# Patient Record
Sex: Female | Born: 1975 | Race: Black or African American | Hispanic: No | Marital: Single | Smoking: Current every day smoker
Health system: Southern US, Community
[De-identification: ages and names within clinical notes are randomized; demographics above are authoritative.]

## PROBLEM LIST (undated history)

## (undated) DIAGNOSIS — M461 Sacroiliitis, not elsewhere classified: Secondary | ICD-10-CM

## (undated) DIAGNOSIS — E8881 Metabolic syndrome: Secondary | ICD-10-CM

## (undated) DIAGNOSIS — E119 Type 2 diabetes mellitus without complications: Secondary | ICD-10-CM

## (undated) DIAGNOSIS — Z8742 Personal history of other diseases of the female genital tract: Secondary | ICD-10-CM

## (undated) DIAGNOSIS — J45909 Unspecified asthma, uncomplicated: Secondary | ICD-10-CM

## (undated) DIAGNOSIS — I10 Essential (primary) hypertension: Secondary | ICD-10-CM

## (undated) DIAGNOSIS — T7840XA Allergy, unspecified, initial encounter: Secondary | ICD-10-CM

## (undated) DIAGNOSIS — J449 Chronic obstructive pulmonary disease, unspecified: Secondary | ICD-10-CM

## (undated) DIAGNOSIS — G8929 Other chronic pain: Secondary | ICD-10-CM

## (undated) DIAGNOSIS — M25562 Pain in left knee: Secondary | ICD-10-CM

## (undated) DIAGNOSIS — M7661 Achilles tendinitis, right leg: Secondary | ICD-10-CM

## (undated) DIAGNOSIS — R590 Localized enlarged lymph nodes: Secondary | ICD-10-CM

## (undated) DIAGNOSIS — M25561 Pain in right knee: Secondary | ICD-10-CM

## (undated) HISTORY — PX: LEEP: SHX91

## (undated) HISTORY — DX: Sacroiliitis, not elsewhere classified: M46.1

## (undated) HISTORY — DX: Metabolic syndrome: E88.810

## (undated) HISTORY — DX: Metabolic syndrome: E88.81

## (undated) HISTORY — PX: CHOLECYSTECTOMY: SHX55

## (undated) HISTORY — DX: Pain in right knee: M25.561

## (undated) HISTORY — DX: Allergy, unspecified, initial encounter: T78.40XA

## (undated) HISTORY — DX: Pain in left knee: M25.562

## (undated) HISTORY — DX: Other chronic pain: G89.29

## (undated) HISTORY — PX: DILATION AND CURETTAGE OF UTERUS: SHX78

## (undated) HISTORY — PX: TUBAL LIGATION: SHX77

## (undated) HISTORY — DX: Localized enlarged lymph nodes: R59.0

## (undated) HISTORY — DX: Personal history of other diseases of the female genital tract: Z87.42

## (undated) HISTORY — DX: Achilles tendinitis, right leg: M76.61

---

## 2004-06-12 ENCOUNTER — Emergency Department: Payer: Self-pay | Admitting: Internal Medicine

## 2004-06-14 ENCOUNTER — Emergency Department: Payer: Self-pay | Admitting: Emergency Medicine

## 2004-06-16 ENCOUNTER — Emergency Department: Payer: Self-pay | Admitting: Emergency Medicine

## 2004-08-25 ENCOUNTER — Emergency Department: Payer: Self-pay | Admitting: Unknown Physician Specialty

## 2005-06-05 ENCOUNTER — Emergency Department: Payer: Self-pay | Admitting: Emergency Medicine

## 2006-01-06 ENCOUNTER — Emergency Department: Payer: Self-pay | Admitting: General Practice

## 2006-04-19 ENCOUNTER — Emergency Department: Payer: Self-pay | Admitting: Emergency Medicine

## 2006-06-09 ENCOUNTER — Emergency Department: Payer: Self-pay | Admitting: Emergency Medicine

## 2006-07-15 ENCOUNTER — Emergency Department: Payer: Self-pay | Admitting: Emergency Medicine

## 2006-10-07 ENCOUNTER — Emergency Department: Payer: Self-pay | Admitting: Emergency Medicine

## 2006-11-17 ENCOUNTER — Emergency Department: Payer: Self-pay | Admitting: Emergency Medicine

## 2006-12-31 ENCOUNTER — Emergency Department: Payer: Self-pay

## 2007-03-21 ENCOUNTER — Emergency Department: Payer: Self-pay | Admitting: Emergency Medicine

## 2007-05-23 ENCOUNTER — Emergency Department: Payer: Self-pay | Admitting: Emergency Medicine

## 2007-09-30 DIAGNOSIS — K219 Gastro-esophageal reflux disease without esophagitis: Secondary | ICD-10-CM

## 2007-10-28 ENCOUNTER — Ambulatory Visit: Payer: Self-pay | Admitting: Family Medicine

## 2007-11-24 ENCOUNTER — Emergency Department: Payer: Self-pay | Admitting: Emergency Medicine

## 2008-02-17 ENCOUNTER — Emergency Department: Payer: Self-pay | Admitting: Emergency Medicine

## 2008-03-23 ENCOUNTER — Emergency Department: Payer: Self-pay | Admitting: Emergency Medicine

## 2008-03-23 ENCOUNTER — Other Ambulatory Visit: Payer: Self-pay

## 2008-06-04 ENCOUNTER — Emergency Department: Payer: Self-pay | Admitting: Emergency Medicine

## 2008-10-05 ENCOUNTER — Emergency Department: Payer: Self-pay | Admitting: Emergency Medicine

## 2008-12-13 ENCOUNTER — Emergency Department: Payer: Self-pay | Admitting: Emergency Medicine

## 2009-01-11 ENCOUNTER — Emergency Department: Payer: Self-pay | Admitting: Emergency Medicine

## 2009-04-11 ENCOUNTER — Emergency Department: Payer: Self-pay | Admitting: Emergency Medicine

## 2009-04-25 ENCOUNTER — Emergency Department: Payer: Self-pay | Admitting: Emergency Medicine

## 2009-05-16 ENCOUNTER — Emergency Department: Payer: Self-pay | Admitting: Emergency Medicine

## 2009-05-24 ENCOUNTER — Emergency Department: Payer: Self-pay | Admitting: Emergency Medicine

## 2009-08-07 ENCOUNTER — Emergency Department: Payer: Self-pay | Admitting: Emergency Medicine

## 2009-09-28 ENCOUNTER — Emergency Department: Payer: Self-pay | Admitting: Unknown Physician Specialty

## 2009-12-15 ENCOUNTER — Emergency Department: Payer: Self-pay | Admitting: Emergency Medicine

## 2010-01-19 ENCOUNTER — Emergency Department: Payer: Self-pay | Admitting: Emergency Medicine

## 2010-05-29 ENCOUNTER — Emergency Department: Payer: Self-pay | Admitting: Emergency Medicine

## 2010-07-20 ENCOUNTER — Ambulatory Visit: Payer: Self-pay | Admitting: Family Medicine

## 2010-08-20 ENCOUNTER — Emergency Department: Payer: Self-pay | Admitting: Emergency Medicine

## 2010-11-11 ENCOUNTER — Emergency Department: Payer: Self-pay | Admitting: Emergency Medicine

## 2011-02-21 ENCOUNTER — Emergency Department: Payer: Self-pay | Admitting: Emergency Medicine

## 2011-03-15 ENCOUNTER — Emergency Department: Payer: Self-pay | Admitting: Emergency Medicine

## 2011-03-29 ENCOUNTER — Emergency Department: Payer: Self-pay | Admitting: Emergency Medicine

## 2011-04-01 ENCOUNTER — Ambulatory Visit: Payer: Self-pay | Admitting: Gastroenterology

## 2011-04-02 ENCOUNTER — Ambulatory Visit: Payer: Self-pay | Admitting: Gastroenterology

## 2011-04-05 ENCOUNTER — Emergency Department: Payer: Self-pay | Admitting: Emergency Medicine

## 2011-05-02 IMAGING — US US EXTREM LOW VENOUS*R*
1 series · 17 of 24 positions shown · non-contrast
Comparison: none

REASON FOR EXAM: one month of calf pain
COMMENTS:   LMP: Two weeks ago

PROCEDURE:     US  - US DOPPLER LOW EXTR RIGHT  - August 20, 2010 [DATE]
RESULT:     Comparison: None

[Series 1: us extrem low venous*right* · 17 of 28 slices shown]
[im 1/28]
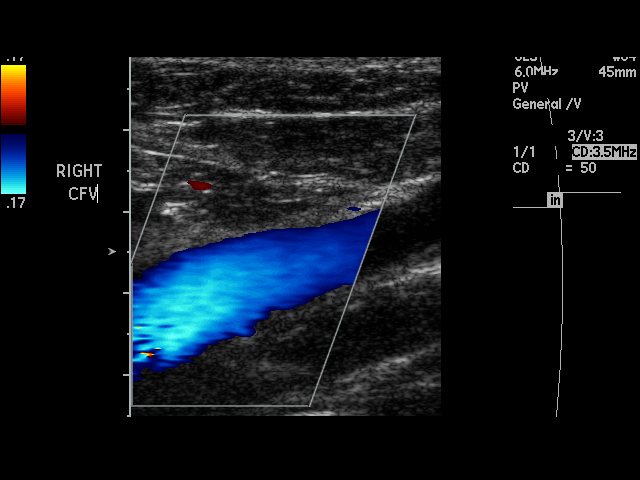
[im 3/28]
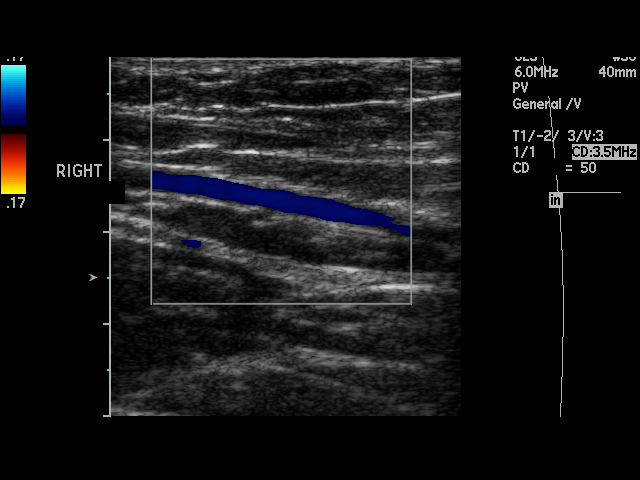
[im 4/28]
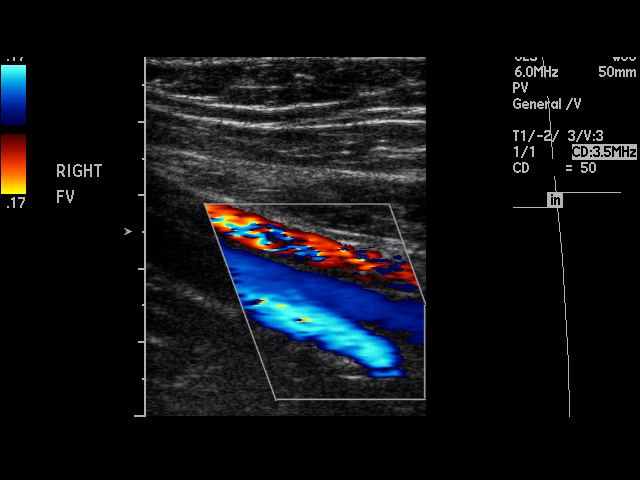
[im 5/28]
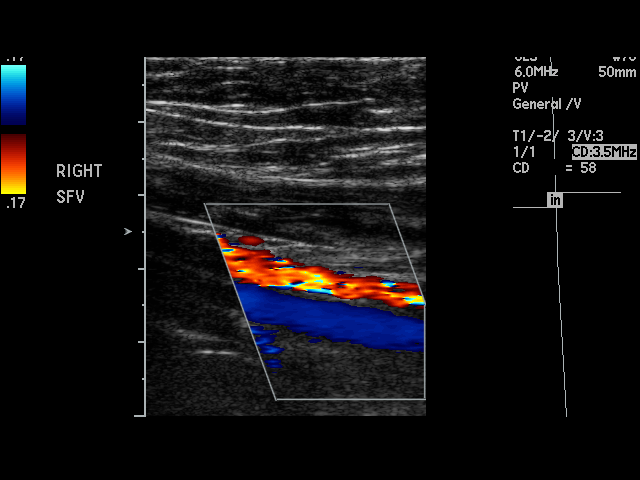
[im 8/28]
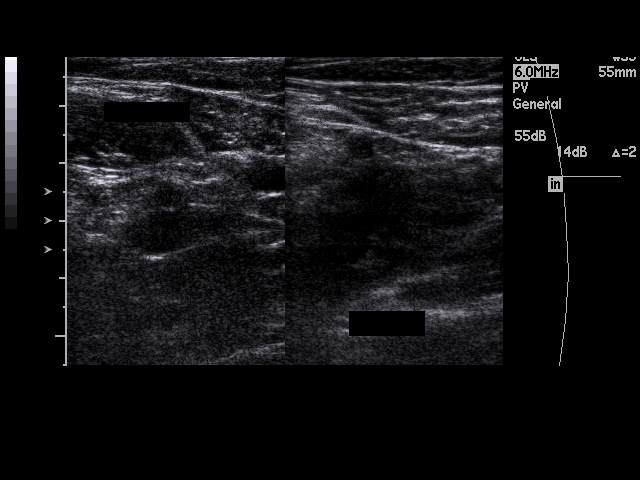
[im 9/28]
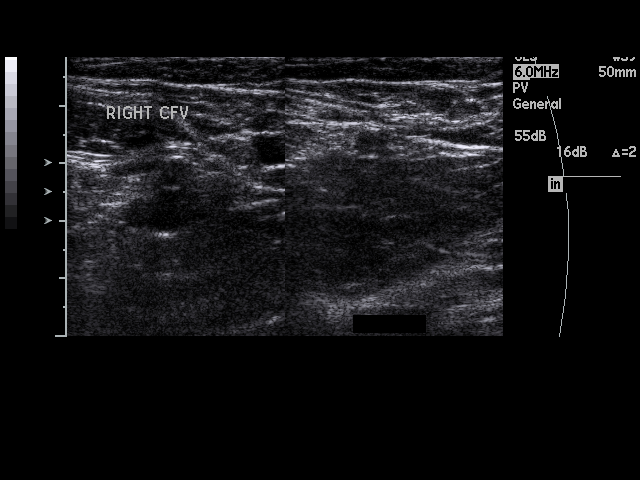
[im 11/28]
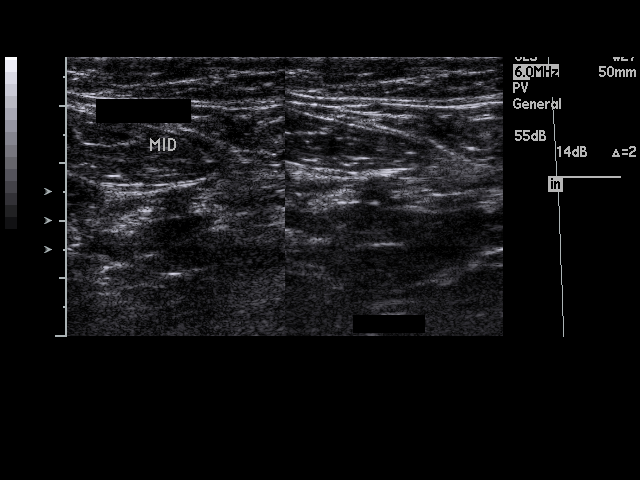
[im 12/28]
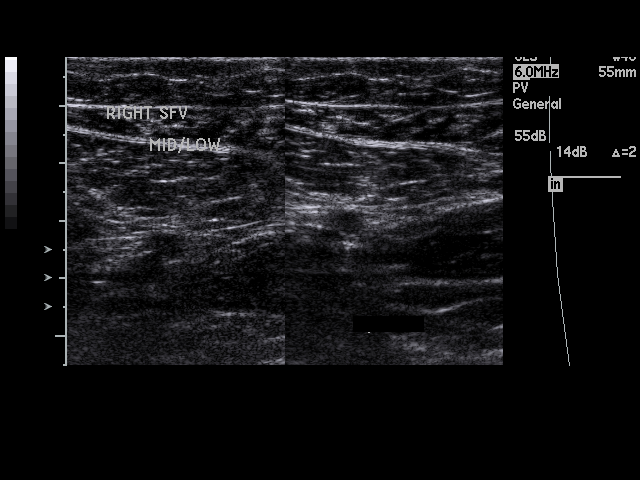
[im 15/28]
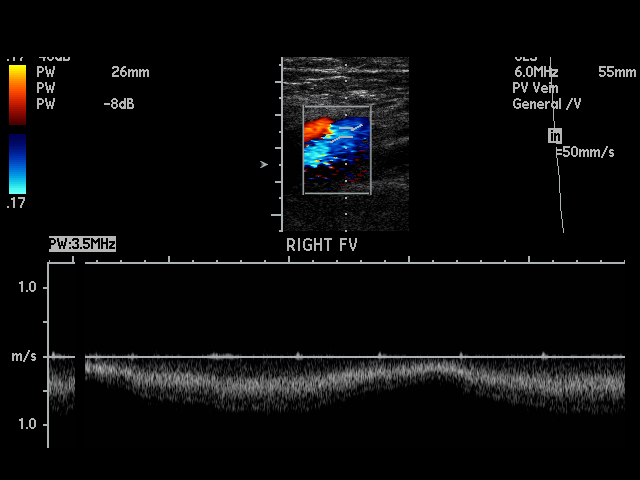
[im 16/28]
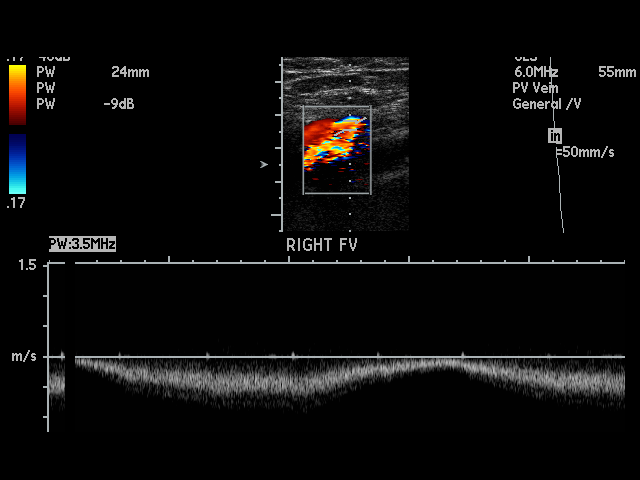
[im 17/28]
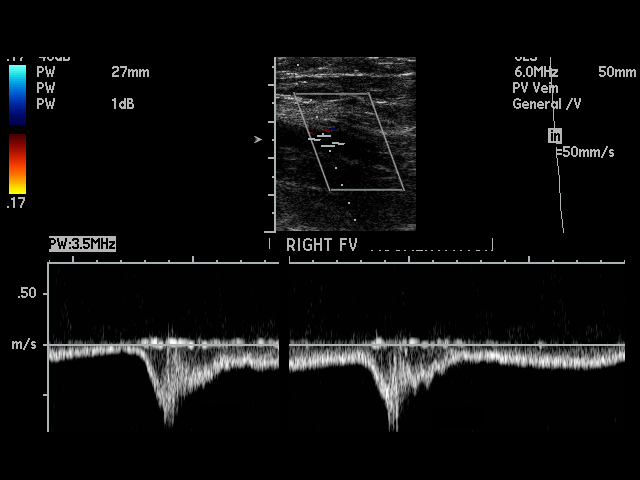
[im 19/28]
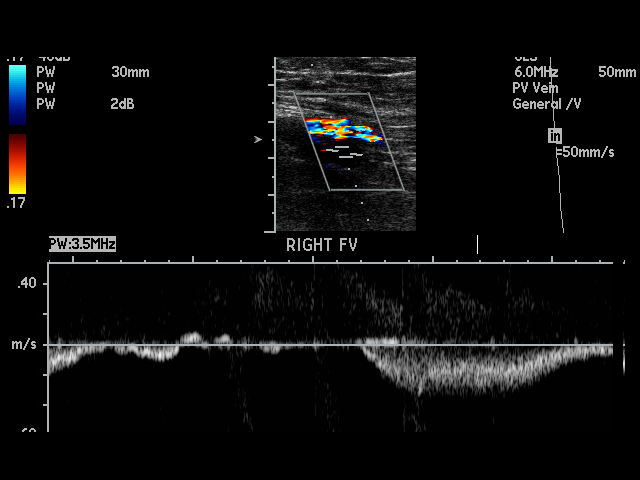
[im 20/28]
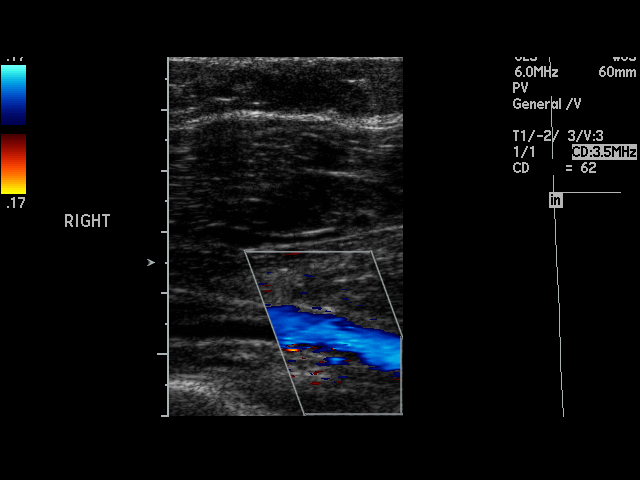
[im 23/28]
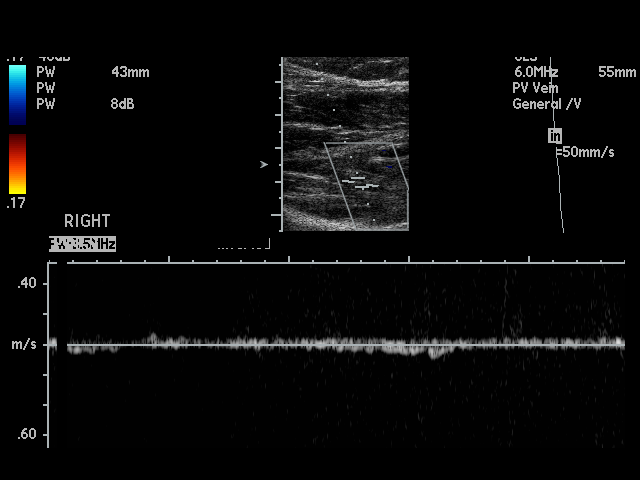
[im 24/28]
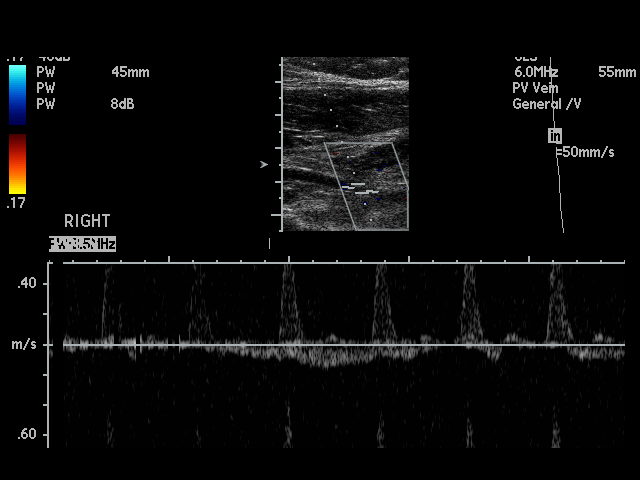
[im 25/28]
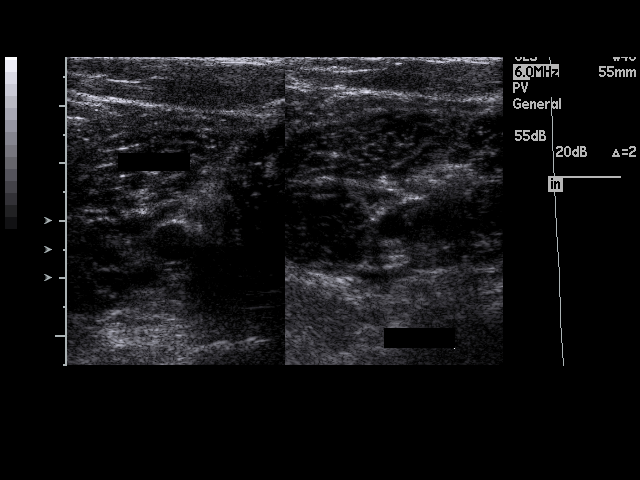
[im 28/28]
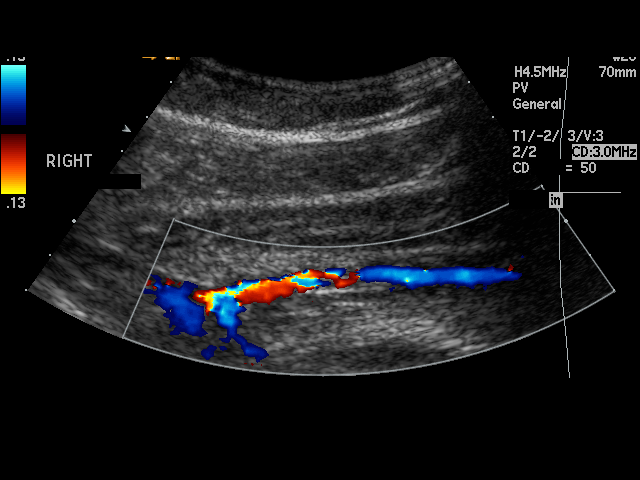

[17 of 24 positions shown; findings below may reference images not displayed]

FINDINGS: Multiple longitudinal and transverse gray-scale as well as color
and spectral Doppler images of the right lower extremity veins were obtained
from the common femoral veins through the popliteal veins.

The right common femoral, greater saphenous, femoral, popliteal veins, and
venous trifurcation are patent, demonstrating normal color-flow and
compressibility. No intraluminal thrombus is identified.There is normal
respiratory variation and augmentation demonstrated at all vein levels.
IMPRESSION: No evidence of DVT in the right lower extremity.

## 2011-11-19 ENCOUNTER — Emergency Department: Payer: Self-pay | Admitting: Emergency Medicine

## 2011-12-31 ENCOUNTER — Ambulatory Visit: Payer: Self-pay | Admitting: Family Medicine

## 2012-02-03 ENCOUNTER — Emergency Department: Payer: Self-pay | Admitting: Emergency Medicine

## 2014-01-08 ENCOUNTER — Emergency Department: Payer: Self-pay | Admitting: Emergency Medicine

## 2014-01-08 LAB — URINALYSIS, COMPLETE
Bacteria: NONE SEEN
Bilirubin,UR: NEGATIVE
GLUCOSE, UR: NEGATIVE mg/dL (ref 0–75)
Ketone: NEGATIVE
Nitrite: NEGATIVE
PH: 7 (ref 4.5–8.0)
Protein: NEGATIVE
RBC,UR: 7 /HPF (ref 0–5)
Specific Gravity: 1.009 (ref 1.003–1.030)
Squamous Epithelial: 9
WBC UR: 4 /HPF (ref 0–5)

## 2014-01-08 LAB — GC/CHLAMYDIA PROBE AMP

## 2014-01-08 LAB — WET PREP, GENITAL

## 2014-08-09 ENCOUNTER — Encounter: Payer: Self-pay | Admitting: Family Medicine

## 2014-08-19 ENCOUNTER — Encounter: Payer: Self-pay | Admitting: Family Medicine

## 2014-09-05 ENCOUNTER — Encounter: Payer: Self-pay | Admitting: Family Medicine

## 2014-09-19 ENCOUNTER — Encounter: Payer: Self-pay | Admitting: Family Medicine

## 2014-12-15 LAB — HM PAP SMEAR: HM Pap smear: NEGATIVE

## 2014-12-16 LAB — LIPID PANEL
CHOLESTEROL: 192 mg/dL (ref 0–200)
HDL: 63 mg/dL (ref 35–70)
LDL Cholesterol: 96 mg/dL
Triglycerides: 163 mg/dL — AB (ref 40–160)

## 2014-12-16 LAB — HEMOGLOBIN A1C: Hgb A1c MFr Bld: 6.6 % — AB (ref 4.0–6.0)

## 2015-01-19 ENCOUNTER — Other Ambulatory Visit: Payer: Self-pay | Admitting: Rheumatology

## 2015-01-19 DIAGNOSIS — M545 Low back pain: Principal | ICD-10-CM

## 2015-01-19 DIAGNOSIS — G8929 Other chronic pain: Secondary | ICD-10-CM

## 2015-01-26 ENCOUNTER — Ambulatory Visit
Admission: RE | Admit: 2015-01-26 | Discharge: 2015-01-26 | Disposition: A | Discharge status: Home / Self Care | Payer: Medicaid Other | Source: Ambulatory Visit | Attending: Rheumatology | Admitting: Rheumatology

## 2015-01-26 ENCOUNTER — Telehealth: Payer: Self-pay | Admitting: Family Medicine

## 2015-01-26 DIAGNOSIS — M545 Low back pain: Principal | ICD-10-CM

## 2015-01-26 DIAGNOSIS — G8929 Other chronic pain: Secondary | ICD-10-CM

## 2015-01-27 MED ORDER — DIAZEPAM 5 MG PO TABS: ORAL_TABLET | ORAL | Status: DC

## 2015-01-27 NOTE — Telephone Encounter (Signed)
Done, sent valium to her pharmacy

## 2015-01-27 NOTE — Telephone Encounter (Signed)
LFT PT MESSAGE

## 2015-02-01 ENCOUNTER — Ambulatory Visit
Admission: RE | Admit: 2015-02-01 | Discharge: 2015-02-01 | Disposition: A | Discharge status: Home / Self Care | Payer: Medicaid Other | Source: Ambulatory Visit | Attending: Rheumatology | Admitting: Rheumatology

## 2015-02-01 DIAGNOSIS — R200 Anesthesia of skin: Secondary | ICD-10-CM | POA: Insufficient documentation

## 2015-02-01 DIAGNOSIS — M545 Low back pain: Secondary | ICD-10-CM | POA: Insufficient documentation

## 2015-02-01 DIAGNOSIS — G8929 Other chronic pain: Secondary | ICD-10-CM | POA: Diagnosis not present

## 2015-02-02 ENCOUNTER — Telehealth: Payer: Self-pay | Admitting: Family Medicine

## 2015-02-02 NOTE — Telephone Encounter (Signed)
Pt had an MRI done with Dr. Gavin Potters and he stated that she has an abnormal disc and she now needs a referral to to Dr. Yves Dill.

## 2015-02-06 ENCOUNTER — Telehealth: Payer: Self-pay | Admitting: Family Medicine

## 2015-02-06 NOTE — Telephone Encounter (Signed)
[facility] called and stated that the pt needs to be referred to Dr. Yves Dill for abn discs and injections. Dr. Yves Dill' office needs a referral before scheduling an appt.

## 2015-02-06 NOTE — Telephone Encounter (Signed)
Called patient to ask why she was being referred to Dr. Yves Dill office, we have no notes or documentation for reasoning. Patient states Dr. Lavenia Atlas ordered a MRI of the lumbar spine and found DegerativeDisc and wants her to follow up with Dr. Yves Dill. I informed the patient we would have to have her come here and sign a medical release form for records before being able to do a referral.

## 2015-02-09 ENCOUNTER — Telehealth: Payer: Self-pay | Admitting: Family Medicine

## 2015-02-10 NOTE — Telephone Encounter (Signed)
errenous encounter °

## 2015-02-15 ENCOUNTER — Encounter: Payer: Self-pay | Admitting: Emergency Medicine

## 2015-02-15 ENCOUNTER — Emergency Department
Admission: EM | Admit: 2015-02-15 | Discharge: 2015-02-15 | Disposition: A | Discharge status: Home / Self Care | Payer: Medicaid Other | Attending: Emergency Medicine | Admitting: Emergency Medicine

## 2015-02-15 DIAGNOSIS — I10 Essential (primary) hypertension: Secondary | ICD-10-CM | POA: Diagnosis not present

## 2015-02-15 DIAGNOSIS — Z91040 Latex allergy status: Secondary | ICD-10-CM | POA: Diagnosis not present

## 2015-02-15 DIAGNOSIS — M5417 Radiculopathy, lumbosacral region: Secondary | ICD-10-CM | POA: Insufficient documentation

## 2015-02-15 DIAGNOSIS — G8929 Other chronic pain: Secondary | ICD-10-CM | POA: Diagnosis not present

## 2015-02-15 DIAGNOSIS — M545 Low back pain: Secondary | ICD-10-CM | POA: Diagnosis present

## 2015-02-15 DIAGNOSIS — M5416 Radiculopathy, lumbar region: Secondary | ICD-10-CM

## 2015-02-15 DIAGNOSIS — E119 Type 2 diabetes mellitus without complications: Secondary | ICD-10-CM | POA: Diagnosis not present

## 2015-02-15 DIAGNOSIS — Z720 Tobacco use: Secondary | ICD-10-CM | POA: Diagnosis not present

## 2015-02-15 HISTORY — DX: Type 2 diabetes mellitus without complications: E11.9

## 2015-02-15 HISTORY — DX: Chronic obstructive pulmonary disease, unspecified: J44.9

## 2015-02-15 HISTORY — DX: Unspecified asthma, uncomplicated: J45.909

## 2015-02-15 HISTORY — DX: Essential (primary) hypertension: I10

## 2015-02-15 MED ORDER — OXYCODONE-ACETAMINOPHEN 7.5-325 MG PO TABS: 1.0000 | ORAL_TABLET | Freq: Four times a day (QID) | ORAL | Status: DC | PRN

## 2015-02-15 MED ORDER — KETOROLAC TROMETHAMINE 10 MG PO TABS: 10.0000 mg | ORAL_TABLET | Freq: Four times a day (QID) | ORAL | Status: DC | PRN

## 2015-02-15 NOTE — ED Notes (Signed)
Patient arrives to ED pod d with c/o generalized pain due to a previously diagnosed slipped disk. Patient is ambulatory in Pod D. Patient states that she sees a pain clinic with her last visit on 02-04-15. Patient denies any other symptoms outside of uncontrolled pain.

## 2015-02-15 NOTE — ED Notes (Signed)
Patient stable at time of discharge. 

## 2015-02-15 NOTE — ED Provider Notes (Signed)
[facility] mergency Department Provider Note  ____________________________________________  Time seen: Approximately 3:10 PM  I have reviewed the triage vital signs and the nursing notes.   HISTORY  Chief Complaint Pain    HPI Kristie Santana is a 39 y.o. female today for chronic back pain. She states she had MRI 3 weeks ago show that she has an abnormal disc. Family doctor not prescribe any pain medicine since she's pending pain management she is not until 03/06/2015. She stated the pain down her left leg. Patient denies any bladder or bowel dysfunction. She is rating her pain as 8 over 10.  Past Medical History  Diagnosis Date  . Diabetes mellitus without complication   . Hypertension   . Asthma   . COPD (chronic obstructive pulmonary disease)     There are no active problems to display for this patient.   Past Surgical History  Procedure Laterality Date  . Leep    . Cholecystectomy      Current Outpatient Rx  Name  Route  Sig  Dispense  Refill  . diazepam (VALIUM) 5 MG tablet      30 minutes prior to procedure and repeat x1prn   2 tablet   0   . ketorolac (TORADOL) 10 MG tablet   Oral   Take 1 tablet (10 mg total) by mouth every 6 (six) hours as needed.   20 tablet   0   . oxyCODONE-acetaminophen (PERCOCET) 7.5-325 MG per tablet   Oral   Take 1 tablet by mouth every 6 (six) hours as needed for severe pain.   12 tablet   0     Allergies Fish allergy; Latex; and Sulfa antibiotics  No family history on file.  Social History History  Substance Use Topics  . Smoking status: Current Some Day Smoker  . Smokeless tobacco: Not on file  . Alcohol Use: Yes    Review of Systems Constitutional: No fever/chills Eyes: No visual changes. ENT: No sore throat. Cardiovascular: Denies chest pain. Respiratory: Denies shortness of breath. Gastrointestinal: No abdominal pain.  No nausea, no vomiting.  No diarrhea.  No  constipation. Genitourinary: Negative for dysuria. Musculoskeletal positive for back pain. Skin: Negative for rash. Neurological: Negative for headaches, focal weakness or numbness. Endocrine: Tension and diabetes. Hematological/Lymphatic: Allergic/Immunilogical: See medication list10-point ROS otherwise negative.  ____________________________________________   PHYSICAL EXAM:  VITAL SIGNS: ED Triage Vitals  Enc Vitals Group     BP 02/15/15 1350 122/70 mmHg     Pulse Rate 02/15/15 1350 89     Resp 02/15/15 1350 22     Temp 02/15/15 1350 98.6 F (37 C)     Temp Source 02/15/15 1350 Oral     SpO2 02/15/15 1350 96 %     Weight --      Height --      Head Cir --      Peak Flow --      Pain Score 02/15/15 1350 8     Pain Loc --      Pain Edu? --      Excl. in GC? --     Constitutional: Alert and oriented. Well appearing and in no acute distress. Eyes: Conjunctivae are normal. PERRL. EOMI. Head: Atraumatic. Nose: No congestion/rhinnorhea. Mouth/Throat: Mucous membranes are moist.  Oropharynx non-erythematous. Neck: No stridor. No cervical spine tenderness to palpation. Hematological/Lymphatic/Immunilogical: No cervical lymphadenopathy. Cardiovascular: Normal rate, regular rhythm. Grossly normal heart sounds.  Good peripheral circulation. Respiratory: Normal respiratory effort.  No retractions. Lungs CTAB. Gastrointestinal: Soft and nontender. No distention. No abdominal bruits. No CVA tenderness. Musculoskeletal:No spinal deformity. TTP L3-S1.  Decreased ROM all fields. Negative straight leg test. Neurologic:  Normal speech and language. No gross focal neurologic deficits are appreciated. Speech is normal. No gait instability. Skin:  Skin is warm, dry and intact. No rash noted. Psychiatric: Mood and affect are normal. Speech and behavior are normal.  ____________________________________________   LABS (all labs ordered are listed, but only abnormal results are  displayed)  Labs Reviewed - No data to display ____________________________________________  EKG   ____________________________________________  RADIOLOGY   ____________________________________________   PROCEDURES  Procedure(s) performed: None  Critical Care performed: No  ____________________________________________   INITIAL IMPRESSION / ASSESSMENT AND PLAN / ED COURSE  Pertinent labs & imaging results that were available during my care of the patient were reviewed by me and considered in my medical decision making (see chart for details).  Chronic back pain.Discuss ER policy on chronic pain management.  Advised follow up with Family Doctor pending Pain clinic appointment. Discharge with Percocet and Toradol. ____________________________________________   FINAL CLINICAL IMPRESSION(S) / ED DIAGNOSES  Final diagnoses:  Chronic radicular pain of lower back      Joni Reining, PA-C 02/15/15 1532  Emily Filbert, MD 02/15/15 [phone]

## 2015-02-15 NOTE — ED Notes (Signed)
Pt reports that she has pain all over, it has been going on for a year. KC referred her to the pain clinic and she has an appt with them on July 18th. Pt reports that she cant take the pain and needs something now. She states that she had an MRI three weeks ago and they told her that she had an abnormal disc.

## 2015-02-22 ENCOUNTER — Encounter: Payer: Self-pay | Admitting: Family Medicine

## 2015-02-22 DIAGNOSIS — Z8742 Personal history of other diseases of the female genital tract: Secondary | ICD-10-CM | POA: Insufficient documentation

## 2015-02-22 DIAGNOSIS — R739 Hyperglycemia, unspecified: Secondary | ICD-10-CM | POA: Insufficient documentation

## 2015-02-22 DIAGNOSIS — J449 Chronic obstructive pulmonary disease, unspecified: Secondary | ICD-10-CM | POA: Insufficient documentation

## 2015-02-22 DIAGNOSIS — E8881 Metabolic syndrome: Secondary | ICD-10-CM | POA: Insufficient documentation

## 2015-02-22 DIAGNOSIS — E1142 Type 2 diabetes mellitus with diabetic polyneuropathy: Secondary | ICD-10-CM | POA: Insufficient documentation

## 2015-02-22 DIAGNOSIS — Z87442 Personal history of urinary calculi: Secondary | ICD-10-CM | POA: Insufficient documentation

## 2015-02-22 DIAGNOSIS — M461 Sacroiliitis, not elsewhere classified: Secondary | ICD-10-CM | POA: Insufficient documentation

## 2015-02-22 DIAGNOSIS — Z8614 Personal history of Methicillin resistant Staphylococcus aureus infection: Secondary | ICD-10-CM | POA: Insufficient documentation

## 2015-02-22 DIAGNOSIS — E785 Hyperlipidemia, unspecified: Secondary | ICD-10-CM | POA: Insufficient documentation

## 2015-02-22 DIAGNOSIS — F17200 Nicotine dependence, unspecified, uncomplicated: Secondary | ICD-10-CM | POA: Insufficient documentation

## 2015-02-22 DIAGNOSIS — M47819 Spondylosis without myelopathy or radiculopathy, site unspecified: Secondary | ICD-10-CM | POA: Insufficient documentation

## 2015-02-22 DIAGNOSIS — E669 Obesity, unspecified: Secondary | ICD-10-CM | POA: Insufficient documentation

## 2015-02-22 DIAGNOSIS — G8929 Other chronic pain: Secondary | ICD-10-CM | POA: Insufficient documentation

## 2015-02-22 DIAGNOSIS — J309 Allergic rhinitis, unspecified: Secondary | ICD-10-CM | POA: Insufficient documentation

## 2015-02-22 DIAGNOSIS — M7660 Achilles tendinitis, unspecified leg: Secondary | ICD-10-CM | POA: Insufficient documentation

## 2015-02-22 DIAGNOSIS — I10 Essential (primary) hypertension: Secondary | ICD-10-CM | POA: Insufficient documentation

## 2015-02-23 ENCOUNTER — Ambulatory Visit: Payer: Self-pay | Admitting: Family Medicine

## 2015-02-23 ENCOUNTER — Ambulatory Visit (INDEPENDENT_AMBULATORY_CARE_PROVIDER_SITE_OTHER): Payer: Medicaid Other | Admitting: Family Medicine

## 2015-02-23 ENCOUNTER — Encounter: Payer: Self-pay | Admitting: Family Medicine

## 2015-02-23 VITALS — BP 124/76 | HR 82 | Temp 97.7°F | Resp 18 | Ht 66.0 in | Wt 210.5 lb

## 2015-02-23 DIAGNOSIS — J411 Mucopurulent chronic bronchitis: Secondary | ICD-10-CM | POA: Diagnosis not present

## 2015-02-23 DIAGNOSIS — G8929 Other chronic pain: Secondary | ICD-10-CM

## 2015-02-23 DIAGNOSIS — J309 Allergic rhinitis, unspecified: Secondary | ICD-10-CM

## 2015-02-23 DIAGNOSIS — M47819 Spondylosis without myelopathy or radiculopathy, site unspecified: Secondary | ICD-10-CM

## 2015-02-23 DIAGNOSIS — E119 Type 2 diabetes mellitus without complications: Secondary | ICD-10-CM | POA: Diagnosis not present

## 2015-02-23 DIAGNOSIS — E785 Hyperlipidemia, unspecified: Secondary | ICD-10-CM | POA: Diagnosis not present

## 2015-02-23 DIAGNOSIS — I10 Essential (primary) hypertension: Secondary | ICD-10-CM | POA: Diagnosis not present

## 2015-02-23 DIAGNOSIS — K219 Gastro-esophageal reflux disease without esophagitis: Secondary | ICD-10-CM

## 2015-02-23 DIAGNOSIS — J3089 Other allergic rhinitis: Secondary | ICD-10-CM

## 2015-02-23 MED ORDER — OXYCODONE-ACETAMINOPHEN 7.5-325 MG PO TABS: 1.0000 | ORAL_TABLET | Freq: Two times a day (BID) | ORAL | Status: DC | PRN

## 2015-02-23 MED ORDER — CHLORTHALIDONE 25 MG PO TABS: 25.0000 mg | ORAL_TABLET | Freq: Every day | ORAL | Status: DC

## 2015-02-23 MED ORDER — OMEPRAZOLE 20 MG PO CPDR: 20.0000 mg | DELAYED_RELEASE_CAPSULE | Freq: Two times a day (BID) | ORAL | Status: DC

## 2015-02-23 MED ORDER — DILTIAZEM HCL ER COATED BEADS 180 MG PO CP24: 180.0000 mg | ORAL_CAPSULE | Freq: Every day | ORAL | Status: DC

## 2015-02-23 MED ORDER — LORATADINE 10 MG PO TABS: 10.0000 mg | ORAL_TABLET | Freq: Every day | ORAL | Status: DC

## 2015-02-23 MED ORDER — METFORMIN HCL 850 MG PO TABS: 850.0000 mg | ORAL_TABLET | Freq: Every day | ORAL | Status: DC

## 2015-02-23 MED ORDER — FLUTICASONE FUROATE-VILANTEROL 100-25 MCG/INH IN AEPB: 1.0000 | INHALATION_SPRAY | Freq: Every day | RESPIRATORY_TRACT | Status: DC

## 2015-02-23 NOTE — Progress Notes (Signed)
Name: Kristie Santana   MRN: [id number]    DOB: 08-19-1976   Date:02/23/2015       Progress Note  Subjective  Chief Complaint  Chief Complaint  Patient presents with  . Medication Refill    3 month F/U  . Diabetes    Does not have a meter to check her sugar-lost 6 pounds since last visit trying to exercise but due to bulging disc unable to do certain stuff  . Hypertension    Improving  . COPD    with Asthma-SOB when smoking too many cigarettes or alot of movement.  . Allergic Rhinitis     Improving  . Back Pain    worsen did a MRI at [facility] found a bulging disc and is being referred to pain clinic on 03/06/2015     HPI  DMII: she was diagnosed in April 2016, hgbA1C was 6.6. She has changed her diet and has lost 6 lbs since last visit. She denies polyphagia, polydipsia or polyuria. She has been taking Metformin as prescribed, and has diarrhea with medication but tolerable.   HTN: bp is at goal, denies side effects of medication . No chest pain or palpitation  COPD/Chronic Bronchitis: using Qvar, she still smokes daily, she has a smokers cough, and has a productive cough at least three to four times weekly. Denies wheezing or SOB.   AR: taking medication and still has post-nasal drainage  Chronic back pain: seen by Dr. Gavin Potters, and did not respond to Humira, waiting to see Dr. Council Mechanic, she states she had a MRI that showed herniated disc disease. She was seen at [facility] and was given percocet and she would like a refill. She is also complaining of bilateral wrist and hand pain.   Patient Active Problem List   Diagnosis Date Noted  . Achilles tendinitis 02/22/2015  . CAFL (chronic airflow limitation) 02/22/2015  . Benign hypertension 02/22/2015  . Chronic pain 02/22/2015  . Inflammation of sacroiliac joint 02/22/2015  . Well controlled diabetes mellitus 02/22/2015  . Dyslipidemia 02/22/2015  . Dysmetabolic syndrome 02/22/2015  . H/O abnormal cervical Papanicolaou smear 02/22/2015   . H/O renal calculi 02/22/2015  . History of methicillin resistant Staphylococcus aureus infection 02/22/2015  . Allergic rhinitis 02/22/2015  . Compulsive tobacco user syndrome 02/22/2015  . 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 Obesity (BMI 30-39.9) 02/22/2015  . Gastro-esophageal reflux disease without esophagitis 09/30/2007    Past Surgical History  Procedure Laterality Date  . Leep    . Cholecystectomy    . Dilation and curettage of uterus    . Tubal ligation      Family History  Problem Relation Age of Onset  . Hypertension Mother   . Hypertension Sister   . Diabetes Sister     History   Social History  . Marital Status: Single    Spouse Name: N/A  . Number of Children: N/A  . Years of Education: N/A   Occupational History  . Not on file.   Social History Main Topics  . Smoking status: Current Some Day Smoker -- 1.00 packs/day for 24 years    Types: Cigarettes    Start date: 02/23/1991  . Smokeless tobacco: Never Used  . Alcohol Use: 0.0 oz/week    0 Standard drinks or equivalent per week     Comment: occasionally  . Drug Use: No  . Sexual Activity:    Partners: Male    Birth Control/ Protection: Other-see comments     Comment: Tubal Ligation  Other Topics Concern  . Not on file   Social History Narrative     Current outpatient prescriptions:  .  albuterol (PROAIR HFA) 108 (90 BASE) MCG/ACT inhaler, Inhale into the lungs., Disp: , Rfl:  .  chlorthalidone (HYGROTON) 25 MG tablet, Take 1 tablet (25 mg total) by mouth daily., Disp: 30 tablet, Rfl: 5 .  diltiazem (CARDIZEM CD) 180 MG 24 hr capsule, Take 1 capsule (180 mg total) by mouth at bedtime., Disp: 30 capsule, Rfl: 2 .  loratadine (CLARITIN) 10 MG tablet, Take 1 tablet (10 mg total) by mouth daily., Disp: 30 tablet, Rfl: 5 .  metFORMIN (GLUCOPHAGE) 850 MG tablet, Take 1 tablet (850 mg total) by mouth daily., Disp: 30 tablet, Rfl: 2 .  omeprazole (PRILOSEC) 20 MG capsule, Take 1 capsule (20 mg  total) by mouth 2 (two) times daily., Disp: 60 capsule, Rfl: 2 .  Fluticasone Furoate-Vilanterol (BREO ELLIPTA) 100-25 MCG/INH AEPB, Inhale 1 puff into the lungs daily., Disp: 1 each, Rfl: 2 .  oxyCODONE-acetaminophen (PERCOCET) 7.5-325 MG per tablet, Take 1 tablet by mouth 2 (two) times daily as needed for severe pain., Disp: 24 tablet, Rfl: 0  Allergies  Allergen Reactions  . Ace Inhibitors Cough  . Fish Allergy Itching and Swelling  . Latex   . Penicillins   . Sulfa Antibiotics Hives     ROS  Constitutional: Negative for fever but has  weight change, lost 6 lbs.  Respiratory: positive for cough negative for  shortness of breath.   Cardiovascular: Negative for chest pain or palpitations.  Gastrointestinal: Negative for abdominal pain, positive for  bowel changes - diarrhea  Musculoskeletal: Negative for gait problem positive for  joint pain and back pain  Skin: Negative for rash.  Neurological: Negative for dizziness or headache.  No other specific complaints in a complete review of systems (except as listed in HPI above).  Objective  Filed Vitals:   02/23/15 1035  BP: 124/76  Pulse: 82  Temp: 97.7 F (36.5 C)  TempSrc: Oral  Resp: 18  Height:  (1.676 m)  Weight: 210 lb 8 oz (95.482 kg)  SpO2: 97%    Body mass index is 33.99 kg/(m^2).  Physical Exam Constitutional: Patient appears well-developed and well-nourished. No distress. Obese Eyes:  No scleral icterus. PERL Neck: Normal range of motion. Neck supple. Cardiovascular: Normal rate, regular rhythm and normal heart sounds.  No murmur heard. No BLE edema. Pulmonary/Chest: Effort normal and breath sounds normal. No respiratory distress. Abdominal: Soft.  There is no tenderness. Psychiatric: Patient has a normal mood and affect. behavior is normal. Judgment and thought content normal. Muscular Skeletal: pain during palpation of lumbar spine, decrease in ROM, she also has bilateral knee pain with extension,  wrist pain but no synovitis  Recent Results (from the past 2160 hour(s))  HM PAP SMEAR     Status: None   Collection Time: 12/15/14 12:00 AM  Result Value Ref Range   HM Pap smear ASCUS but neg HPV   Lipid panel     Status: Abnormal   Collection Time: 12/16/14 12:00 AM  Result Value Ref Range   Triglycerides 163 (A) 40 - 160 mg/dL   Cholesterol 161 0 - 096 mg/dL   HDL 63 35 - 70 mg/dL   LDL Cholesterol 96 mg/dL  Hemoglobin E4V     Status: Abnormal   Collection Time: 12/16/14 12:00 AM  Result Value Ref Range   Hgb A1c MFr Bld 6.6 (A) 4.0 - 6.0 %  Diabetic Foot Exam - Simple   Simple Foot Form  Visual Inspection  No deformities, no ulcerations, no other skin breakdown bilaterally:  Yes  Sensation Testing  Intact to touch and monofilament testing bilaterally:  Yes  Pulse Check  Posterior Tibialis and Dorsalis pulse intact bilaterally:  Yes  Comments      PHQ2/9: Depression screen PHQ 2/9 02/23/2015  Decreased Interest 3  Down, Depressed, Hopeless 1  PHQ - 2 Score 4  Altered sleeping 3  Tired, decreased energy 1  Change in appetite 0  Feeling bad or failure about yourself  1  Trouble concentrating 0  Moving slowly or fidgety/restless 0  Suicidal thoughts 0  PHQ-9 Score 9  Difficult doing work/chores Very difficult   She states she is depressed because of her pain, she refuses starting on medication or therapy   Fall Risk: Fall Risk  02/23/2015  Falls in the past year? Yes  Number falls in past yr: 2 or more  Injury with Fall? Yes    Assessment & Plan  1. Dyslipidemia ASCVD 4%, explained that because of DM, she should start statin, but she wants to hold off, also discussed aspirin 81mg  daily and she will think about it  2. Benign hypertension At goal - diltiazem (CARDIZEM CD) 180 MG 24 hr capsule; Take 1 capsule (180 mg total) by mouth at bedtime.  Dispense: 30 capsule; Refill: 2 - chlorthalidone (HYGROTON) 25 MG tablet; Take 1 tablet (25 mg total) by mouth  daily.  Dispense: 30 tablet; Refill: 5  3. Well controlled diabetes mellitus Continue medication  - metFORMIN (GLUCOPHAGE) 850 MG tablet; Take 1 tablet (850 mg total) by mouth daily.  Dispense: 30 tablet; Refill: 2  4. Spondyloarthropathy Refill of medication for Dr. Council Mechanichasniss - oxyCODONE-acetaminophen (PERCOCET) 7.5-325 MG per tablet; Take 1 tablet by mouth 2 (two) times daily as needed for severe pain.  Dispense: 24 tablet; Refill: 0  5. Chronic pain  - oxyCODONE-acetaminophen (PERCOCET) 7.5-325 MG per tablet; Take 1 tablet by mouth 2 (two) times daily as needed for severe pain.  Dispense: 24 tablet; Refill: 0  6. Mucopurulent chronic bronchitis  - Fluticasone Furoate-Vilanterol (BREO ELLIPTA) 100-25 MCG/INH AEPB; Inhale 1 puff into the lungs daily.  Dispense: 1 each; Refill: 2  7. Gastro-esophageal reflux disease without esophagitis  - omeprazole (PRILOSEC) 20 MG capsule; Take 1 capsule (20 mg total) by mouth 2 (two) times daily.  Dispense: 60 capsule; Refill: 2  8. Perennial allergic rhinitis  - loratadine (CLARITIN) 10 MG tablet; Take 1 tablet (10 mg total) by mouth daily.  Dispense: 30 tablet; Refill: 5

## 2015-02-23 NOTE — Patient Instructions (Signed)

## 2015-02-24 ENCOUNTER — Other Ambulatory Visit: Payer: Self-pay

## 2015-03-03 ENCOUNTER — Other Ambulatory Visit: Payer: Self-pay | Admitting: Family Medicine

## 2015-03-03 NOTE — Telephone Encounter (Signed)
Patient requesting refill. 

## 2015-03-06 ENCOUNTER — Other Ambulatory Visit: Payer: Self-pay

## 2015-03-06 ENCOUNTER — Other Ambulatory Visit: Payer: Self-pay | Admitting: Family Medicine

## 2015-03-06 DIAGNOSIS — J42 Unspecified chronic bronchitis: Secondary | ICD-10-CM

## 2015-03-06 MED ORDER — FLUTICASONE-SALMETEROL 100-50 MCG/DOSE IN AEPB: 1.0000 | INHALATION_SPRAY | Freq: Two times a day (BID) | RESPIRATORY_TRACT | Status: DC

## 2015-03-20 ENCOUNTER — Ambulatory Visit (INDEPENDENT_AMBULATORY_CARE_PROVIDER_SITE_OTHER): Payer: Medicaid Other | Admitting: Family Medicine

## 2015-03-20 ENCOUNTER — Encounter: Payer: Self-pay | Admitting: Family Medicine

## 2015-03-20 VITALS — BP 124/73 | HR 97 | Temp 98.0°F | Resp 17 | Ht 66.0 in | Wt 201.0 lb

## 2015-03-20 DIAGNOSIS — J020 Streptococcal pharyngitis: Secondary | ICD-10-CM | POA: Diagnosis not present

## 2015-03-20 DIAGNOSIS — T3695XA Adverse effect of unspecified systemic antibiotic, initial encounter: Secondary | ICD-10-CM

## 2015-03-20 DIAGNOSIS — B379 Candidiasis, unspecified: Secondary | ICD-10-CM | POA: Diagnosis not present

## 2015-03-20 MED ORDER — FLUCONAZOLE 150 MG PO TABS: 150.0000 mg | ORAL_TABLET | Freq: Once | ORAL | Status: DC

## 2015-03-20 MED ORDER — AZITHROMYCIN 250 MG PO TABS: 250.0000 mg | ORAL_TABLET | Freq: Every day | ORAL | Status: DC

## 2015-03-20 NOTE — Progress Notes (Signed)
Name: Kristie Santana   MRN: [id number]    DOB: 06-Jul-1976   Date:03/20/2015       Progress Note  Subjective  Chief Complaint  Chief Complaint  Patient presents with  . Acute Visit    Sore throat/ ear ache x2 days  . Diabetes  . Hyperlipidemia    Sore Throat  This is a new problem. The current episode started in the past 7 days. There has been no fever. The pain is at a severity of 8/10. The pain is moderate. Associated symptoms include coughing, ear pain and trouble swallowing. Pertinent negatives include no ear discharge or headaches. She has had no exposure to strep or mono. She has tried nothing for the symptoms.      Past Medical History  Diagnosis Date  . Diabetes mellitus without complication   . Hypertension   . Asthma   . COPD (chronic obstructive pulmonary disease)   . Left cervical lymphadenopathy   . Dysmetabolic syndrome   . Bilateral sacroiliitis   . Bilateral chronic knee pain   . Achilles tendinitis of right lower extremity   . History of abnormal cervical Pap smear   . Allergy     Past Surgical History  Procedure Laterality Date  . Leep    . Cholecystectomy    . Dilation and curettage of uterus    . Tubal ligation      Family History  Problem Relation Age of Onset  . Hypertension Mother   . Hypertension Sister   . Diabetes Sister     History   Social History  . Marital Status: Single    Spouse Name: N/A  . Number of Children: N/A  . Years of Education: N/A   Occupational History  . Not on file.   Social History Main Topics  . Smoking status: Current Some Day Smoker -- 1.00 packs/day for 24 years    Types: Cigarettes    Start date: 02/23/1991  . Smokeless tobacco: Never Used  . Alcohol Use: 0.0 oz/week    0 Standard drinks or equivalent per week     Comment: occasionally  . Drug Use: No  . Sexual Activity:    Partners: Male    Birth Control/ Protection: Other-see comments     Comment: Tubal Ligation   Other Topics Concern  . Not  on file   Social History Narrative     Current outpatient prescriptions:  .  chlorthalidone (HYGROTON) 25 MG tablet, Take 1 tablet (25 mg total) by mouth daily., Disp: 30 tablet, Rfl: 5 .  diazepam (VALIUM) 5 MG tablet, take 1 tablet by mouth 30 MINUTES PRIOR TO PROCEDURE. AND 1 tablet if needed, Disp: , Rfl: 0 .  diltiazem (CARDIZEM CD) 180 MG 24 hr capsule, Take 1 capsule (180 mg total) by mouth at bedtime., Disp: 30 capsule, Rfl: 2 .  Fluticasone Furoate-Vilanterol (BREO ELLIPTA) 100-25 MCG/INH AEPB, Inhale 1 puff into the lungs daily., Disp: 1 each, Rfl: 2 .  Fluticasone-Salmeterol (ADVAIR) 100-50 MCG/DOSE AEPB, Inhale 1 puff into the lungs 2 (two) times daily., Disp: 1 each, Rfl: 3 .  ketorolac (TORADOL) 10 MG tablet, TAKE 1 TABLET BY MOUTH EVERY 6 HOURS AS NEEDED. (MAX 5 DAYS), Disp: , Rfl: 0 .  loratadine (CLARITIN) 10 MG tablet, Take 1 tablet (10 mg total) by mouth daily., Disp: 30 tablet, Rfl: 5 .  metFORMIN (GLUCOPHAGE) 850 MG tablet, Take 1 tablet (850 mg total) by mouth daily., Disp: 30 tablet, Rfl: 2 .  omeprazole (PRILOSEC) 20 MG capsule, Take 1 capsule (20 mg total) by mouth 2 (two) times daily., Disp: 60 capsule, Rfl: 2 .  omeprazole (PRILOSEC) 20 MG capsule, Take by mouth., Disp: , Rfl:  .  PROAIR HFA 108 (90 BASE) MCG/ACT inhaler, inhale 2 puffs by mouth every 4 hours if needed FOR SHORTNESS OF BREATH/WHEEZING, Disp: 8.5 Inhaler, Rfl: 1 .  tiZANidine (ZANAFLEX) 4 MG tablet, take 1 tablet by mouth twice a day if needed, Disp: , Rfl: 0 .  tiZANidine (ZANAFLEX) 4 MG tablet, 1 po bid prn, Disp: , Rfl:   Allergies  Allergen Reactions  . Ace Inhibitors Cough  . Fish Allergy Itching and Swelling  . Latex   . Penicillins   . Sulfa Antibiotics Hives     Review of Systems  Constitutional: Positive for chills. Negative for fever.  HENT: Positive for ear pain, sore throat and trouble swallowing. Negative for ear discharge.   Respiratory: Positive for cough.   Neurological:  Negative for headaches.      Objective  Filed Vitals:   03/20/15 0912  BP: 124/73  Pulse: 97  Temp: 98 F (36.7 C)  TempSrc: Oral  Resp: 17  Height: 5\' 6"  (1.676 m)  Weight: 201 lb (91.173 kg)  SpO2: 98%    Physical Exam  Constitutional: She is well-developed, well-nourished, and in no distress.  HENT:  Right Ear: Hearing and tympanic membrane normal.  Left Ear: Hearing and tympanic membrane normal.  Mouth/Throat: Oropharyngeal exudate and posterior oropharyngeal erythema present.  Cardiovascular: Normal rate and regular rhythm.   Pulmonary/Chest: Effort normal and breath sounds normal.  Lymphadenopathy:       Right cervical: No superficial cervical and no deep cervical adenopathy present.      Left cervical: No superficial cervical and no deep cervical adenopathy present.  Nursing note and vitals reviewed.     Assessment & Plan  1. Streptococcal pharyngitis Strep pharyngitis confirmed with testing. Patient will be started on azithromycin and advised conservative therapy including salt water gargles. RTC if no clinical improvement within 48 hours. - azithromycin (ZITHROMAX Z-PAK) 250 MG tablet; Take 1 tablet (250 mg total) by mouth daily. 2 tabs po x day 1, then 1 tab po q day x 4 days.  Dispense: 6 each; Refill: 0 - POCT rapid strep A  2. Antibiotic-induced yeast infection Diflucan 150 mg by mouth 1 time dose in case of antibiotic-induced yeast infection. - fluconazole (DIFLUCAN) 150 MG tablet; Take 1 tablet (150 mg total) by mouth once.  Dispense: 1 tablet; Refill: 0   Scarlet Abad Asad [facility] 03/20/2015 9:46 AM

## 2015-05-19 ENCOUNTER — Emergency Department
Admission: EM | Admit: 2015-05-19 | Discharge: 2015-05-19 | Disposition: A | Discharge status: Home / Self Care | Payer: Medicaid Other | Attending: Emergency Medicine | Admitting: Emergency Medicine

## 2015-05-19 DIAGNOSIS — L03811 Cellulitis of head [any part, except face]: Secondary | ICD-10-CM | POA: Insufficient documentation

## 2015-05-19 DIAGNOSIS — L0291 Cutaneous abscess, unspecified: Secondary | ICD-10-CM

## 2015-05-19 DIAGNOSIS — B373 Candidiasis of vulva and vagina: Secondary | ICD-10-CM | POA: Diagnosis not present

## 2015-05-19 DIAGNOSIS — Z79899 Other long term (current) drug therapy: Secondary | ICD-10-CM | POA: Insufficient documentation

## 2015-05-19 DIAGNOSIS — B3731 Acute candidiasis of vulva and vagina: Secondary | ICD-10-CM

## 2015-05-19 DIAGNOSIS — L0231 Cutaneous abscess of buttock: Secondary | ICD-10-CM | POA: Diagnosis present

## 2015-05-19 DIAGNOSIS — E119 Type 2 diabetes mellitus without complications: Secondary | ICD-10-CM | POA: Diagnosis not present

## 2015-05-19 DIAGNOSIS — Z720 Tobacco use: Secondary | ICD-10-CM | POA: Insufficient documentation

## 2015-05-19 DIAGNOSIS — Z7951 Long term (current) use of inhaled steroids: Secondary | ICD-10-CM | POA: Diagnosis not present

## 2015-05-19 DIAGNOSIS — L0390 Cellulitis, unspecified: Secondary | ICD-10-CM

## 2015-05-19 DIAGNOSIS — I10 Essential (primary) hypertension: Secondary | ICD-10-CM | POA: Insufficient documentation

## 2015-05-19 DIAGNOSIS — Z880 Allergy status to penicillin: Secondary | ICD-10-CM | POA: Insufficient documentation

## 2015-05-19 MED ORDER — FLUCONAZOLE 150 MG PO TABS: 150.0000 mg | ORAL_TABLET | ORAL | Status: DC

## 2015-05-19 MED ORDER — CLINDAMYCIN HCL 150 MG PO CAPS: 150.0000 mg | ORAL_CAPSULE | Freq: Four times a day (QID) | ORAL | Status: AC

## 2015-05-19 NOTE — ED Notes (Signed)
Pt has abscess on both buttocks. One is open on the right side, other is not opened.  Patient c/o yeast like infection.  C/o pain 8/10

## 2015-05-19 NOTE — ED Notes (Signed)
Pt reports she has an abscess on her bottom. Pt reports also has vaginal discharge that is white in color. Pt also reports has an area on her left head. Thinks it may be an ingrown hair.

## 2015-05-19 NOTE — ED Provider Notes (Signed)
[facility] Emergency Department Provider Note  ____________________________________________  Time seen: Approximately 11:20 AM  I have reviewed the triage vital signs and the nursing notes.   HISTORY  Chief Complaint Abscess    HPI Kristie Santana is a 39 y.o. female presents to the emergency department complaining of an abscess on both buttocks. She also states that she has recently taken some antibiotics for cellulitis on her scalp due to an ingrown hair and now has a "yeast infection." She states that the abscess on her right buttocks began 10-14 days ago and ruptured by itself and has been draining over the last 3-4 days. She states that she now has an area on her left buttocks that is feeling" similar to the first." She denies that area having open or draining. She denies fever/chills, nausea or vomiting, area or constipation.  The patient reports having a current East infection from her previous antibiotics. He states that she called her primary care but they have not sent and a prescription for the same. She has had multiple East infections in the past and reports that the symptoms are the same with itching and white discharge.   Past Medical History  Diagnosis Date  . Diabetes mellitus without complication   . Hypertension   . Asthma   . COPD (chronic obstructive pulmonary disease)   . Left cervical lymphadenopathy   . Dysmetabolic syndrome   . Bilateral sacroiliitis   . Bilateral chronic knee pain   . Achilles tendinitis of right lower extremity   . History of abnormal cervical Pap smear   . Allergy     Patient Active Problem List   Diagnosis Date Noted  . Streptococcal pharyngitis 03/20/2015  . Achilles tendinitis 02/22/2015  . CAFL (chronic airflow limitation) 02/22/2015  . Benign hypertension 02/22/2015  . Chronic pain 02/22/2015  . Inflammation of sacroiliac joint 02/22/2015  . Well controlled diabetes mellitus 02/22/2015  .  Dyslipidemia 02/22/2015  . Dysmetabolic syndrome 02/22/2015  . H/O abnormal cervical Papanicolaou smear 02/22/2015  . H/O renal calculi 02/22/2015  . History of methicillin resistant Staphylococcus aureus infection 02/22/2015  . Allergic rhinitis 02/22/2015  . Compulsive tobacco user syndrome 02/22/2015  . 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 Obesity (BMI 30-39.9) 02/22/2015  . Gastro-esophageal reflux disease without esophagitis 09/30/2007    Past Surgical History  Procedure Laterality Date  . Leep    . Cholecystectomy    . Dilation and curettage of uterus    . Tubal ligation      Current Outpatient Rx  Name  Route  Sig  Dispense  Refill  . azithromycin (ZITHROMAX Z-PAK) 250 MG tablet   Oral   Take 1 tablet (250 mg total) by mouth daily. 2 tabs po x day 1, then 1 tab po q day x 4 days.   6 each   0   . chlorthalidone (HYGROTON) 25 MG tablet   Oral   Take 1 tablet (25 mg total) by mouth daily.   30 tablet   5   . diazepam (VALIUM) 5 MG tablet      take 1 tablet by mouth 30 MINUTES PRIOR TO PROCEDURE. AND 1 tablet if needed      0   . diltiazem (CARDIZEM CD) 180 MG 24 hr capsule   Oral   Take 1 capsule (180 mg total) by mouth at bedtime.   30 capsule   2   . fluconazole (DIFLUCAN) 150 MG tablet   Oral   Take 1  tablet (150 mg total) by mouth once.   1 tablet   0   . Fluticasone Furoate-Vilanterol (BREO ELLIPTA) 100-25 MCG/INH AEPB   Inhalation   Inhale 1 puff into the lungs daily.   1 each   2   . Fluticasone-Salmeterol (ADVAIR) 100-50 MCG/DOSE AEPB   Inhalation   Inhale 1 puff into the lungs 2 (two) times daily.   1 each   3   . ketorolac (TORADOL) 10 MG tablet      TAKE 1 TABLET BY MOUTH EVERY 6 HOURS AS NEEDED. (MAX 5 DAYS)      0   . loratadine (CLARITIN) 10 MG tablet   Oral   Take 1 tablet (10 mg total) by mouth daily.   30 tablet   5   . metFORMIN (GLUCOPHAGE) 850 MG tablet   Oral   Take 1 tablet (850 mg total) by mouth daily.   30  tablet   2   . omeprazole (PRILOSEC) 20 MG capsule   Oral   Take 1 capsule (20 mg total) by mouth 2 (two) times daily.   60 capsule   2   . omeprazole (PRILOSEC) 20 MG capsule   Oral   Take by mouth.         Marland Kitchen PROAIR HFA 108 (90 BASE) MCG/ACT inhaler      inhale 2 puffs by mouth every 4 hours if needed FOR SHORTNESS OF BREATH/WHEEZING   8.5 Inhaler   1   . tiZANidine (ZANAFLEX) 4 MG tablet      take 1 tablet by mouth twice a day if needed      0   . tiZANidine (ZANAFLEX) 4 MG tablet      1 po bid prn           Allergies Ace inhibitors; Fish allergy; Latex; Penicillins; and Sulfa antibiotics  Family History  Problem Relation Age of Onset  . Hypertension Mother   . Hypertension Sister   . Diabetes Sister     Social History Social History  Substance Use Topics  . Smoking status: Current Some Day Smoker -- 1.00 packs/day for 24 years    Types: Cigarettes    Start date: 02/23/1991  . Smokeless tobacco: Never Used  . Alcohol Use: 0.0 oz/week    0 Standard drinks or equivalent per week     Comment: occasionally    Review of Systems Constitutional: No fever/chills Eyes: No visual changes. ENT: No sore throat. Cardiovascular: Denies chest pain. Respiratory: Denies shortness of breath. Gastrointestinal: No abdominal pain.  No nausea, no vomiting.  No diarrhea.  No constipation. Genitourinary: Negative for dysuria. Musculoskeletal: Negative for back pain. Skin: Negative for rash. Positive for abscess on right and left buttocks. Neurological: Negative for headaches, focal weakness or numbness.  10-point ROS otherwise negative.  ____________________________________________   PHYSICAL EXAM:  VITAL SIGNS: ED Triage Vitals  Enc Vitals Group     BP 05/19/15 1028 113/73 mmHg     Pulse Rate 05/19/15 1028 88     Resp 05/19/15 1028 16     Temp 05/19/15 1028 98.5 F (36.9 C)     Temp Source 05/19/15 1028 Oral     SpO2 05/19/15 1028 98 %     Weight  05/19/15 1030 196 lb 11.2 oz (89.223 kg)     Height 05/19/15 1028  (1.651 m)     Head Cir --      Peak Flow --      Pain  Score 05/19/15 1035 8     Pain Loc --      Pain Edu? --      Excl. in GC? --     Constitutional: Alert and oriented. Well appearing and in no acute distress. Eyes: Conjunctivae are normal. PERRL. EOMI. Head: Atraumatic. Nose: No congestion/rhinnorhea. Mouth/Throat: Mucous membranes are moist.  Oropharynx non-erythematous. Neck: No stridor.   Hematological/Lymphatic/Immunilogical: No cervical lymphadenopathy. Cardiovascular: Normal rate, regular rhythm. Grossly normal heart sounds.  Good peripheral circulation. Respiratory: Normal respiratory effort.  No retractions. Lungs CTAB. Gastrointestinal: Soft and nontender. No distention. No abdominal bruits. No CVA tenderness. Genitourinary: Patient declined pelvic exam. Musculoskeletal: No lower extremity tenderness nor edema.  No joint effusions. Neurologic:  Normal speech and language. No gross focal neurologic deficits are appreciated. No gait instability. Skin:  Skin is warm, dry and intact. No rash noted. Lesion noted to right buttocks in the 11:00 position. Area is erythematous and mildly edematous. Central erosion with purulent charge. No fluctuance noted. Firmness palpated around central erosion. Total area is 2 x 2 centimeters. Localized erythema and edema noted to left buttocks in the central region. No visible drainage. Area is approximately 0.5 x 0.5 cm Psychiatric: Mood and affect are normal. Speech and behavior are normal.  ____________________________________________   LABS (all labs ordered are listed, but only abnormal results are displayed)  Labs Reviewed - No data to display ____________________________________________  EKG   ____________________________________________  RADIOLOGY   ____________________________________________   PROCEDURES  Procedure(s) performed: None  Critical  Care performed: No  ____________________________________________   INITIAL IMPRESSION / ASSESSMENT AND PLAN / ED COURSE  Pertinent labs & imaging results that were available during my care of the patient were reviewed by me and considered in my medical decision making (see chart for details).  The patient's history, symptoms, and exam is most consistent with abscess to the right buttocks and cellulitis to the left buttocks. The area is firm to palpation on both. No signs of fluctuance. Abscess on right is draining purulent material. No open wound that requires packing. There is no need to incise and drain this area at this time. Discussed these findings and diagnosis with patient. She verbalized understanding. Discussed with options with patient and we'll place patient on antibiotic coverage. I am prescribing clindamycin due to patient's recent antibiotic use or other skin infection and for coverage of MRSA. Advised patient of risk for C. difficile infection on his medication. Gave strict precautions for follow-up should symptoms of C. difficile present. Advised patient to be taking a probiotic throughout the entire course of her antibiotic use.  Patient is also complaining of a yeast infection. She was supposed to receive prescriptions from her primary care but did not receive any for same. This ascending infection is from a previous use of antibiotics for cellulitis from an ingrown hair on her scalp. Patient declined a pelvic exam for further evaluation. We will provide a prescription for Diflucan for coverage. Advised patient to take prescription after finishing up this course of antibiotics. If symptoms persist advised patient to follow-up with her OB/GYN.  The patient was given strict ED precautions should area worsen on either buttocks. The patient instructions to take a probiotic as well as her antibiotics. ____________________________________________   FINAL CLINICAL IMPRESSION(S) / ED  DIAGNOSES  Final diagnoses:  Abscess and cellulitis  Vaginal candida      Racheal Patches, PA-C 05/19/15 1140  Delorise Royals Sherman, PA-C 05/19/15 1145  Arnaldo Natal, MD 05/19/15 [id number]

## 2015-05-19 NOTE — Discharge Instructions (Signed)
Abscess An abscess (boil or furuncle) is an infected area on or under the skin. This area is filled with yellowish-white fluid (pus) and other material (debris). HOME CARE   Only take medicines as told by your doctor.  If you were given antibiotic medicine, take it as directed. Finish the medicine even if you start to feel better.  If gauze is used, follow your doctor's directions for changing the gauze.  To avoid spreading the infection:  Keep your abscess covered with a bandage.  Wash your hands well.  Do not share personal care items, towels, or whirlpools with others.  Avoid skin contact with others.  Keep your skin and clothes clean around the abscess.  Keep all doctor visits as told. GET HELP RIGHT AWAY IF:   You have more pain, puffiness (swelling), or redness in the wound site.  You have more fluid or blood coming from the wound site.  You have muscle aches, chills, or you feel sick.  You have a fever. MAKE SURE YOU:   Understand these instructions.  Will watch your condition.  Will get help right away if you are not doing well or get worse. Document Released: 01/22/2008 Document Revised: 02/04/2012 Document Reviewed: 10/18/2011 [facility] Patient Information 2015 [place], [place]. This information is not intended to replace advice given to you by your health care provider. Make sure you discuss any questions you have with your health care provider.  Candidal Vulvovaginitis Candidal vulvovaginitis is an infection of the vagina and vulva. The vulva is the skin around the opening of the vagina. This may cause itching and discomfort in and around the vagina.  HOME CARE  Only take medicine as told by your doctor.  Do not have sex (intercourse) until the infection is healed or as told by your doctor.  Practice safe sex.  Tell your sex partner about your infection.  Do not douche or use tampons.  Wear cotton underwear. Do not wear tight pants or panty  hose.  Eat yogurt. This may help treat and prevent yeast infections. GET HELP RIGHT AWAY IF:   You have a fever.  Your problems get worse during treatment or do not get better in 3 days.  You have discomfort, irritation, or itching in your vagina or vulva area.  You have pain after sex.  You start to get belly (abdominal) pain. MAKE SURE YOU:  Understand these instructions.  Will watch your condition.  Will get help right away if you are not doing well or get worse. Document Released: 11/01/2008 Document Revised: 08/10/2013 Document Reviewed: 11/01/2008 [facility] Patient Information 2015 [place], [place]. This information is not intended to replace advice given to you by your health care provider. Make sure you discuss any questions you have with your health care provider.

## 2015-05-19 NOTE — ED Notes (Signed)
In room with PA to observe abscesses on buttocks. No interventions done at this time.

## 2015-05-19 NOTE — ED Notes (Signed)
E signature pad not working, patient signed print out signature.

## 2015-05-23 ENCOUNTER — Telehealth: Payer: Self-pay | Admitting: Family Medicine

## 2015-05-23 NOTE — Telephone Encounter (Signed)
Patient was seeing Dr Yves Dill [facility]) for cortizone shot and he just told her that it was nothing else he chould do for her due to she having so much pain. He told her to call her primary doctor to get a referral to the pain clinc.

## 2015-05-24 NOTE — Telephone Encounter (Signed)
She will need to see me again before I can make the referral

## 2015-05-24 NOTE — Telephone Encounter (Signed)
Pt has an appt scheduled on Monday.

## 2015-05-24 NOTE — Telephone Encounter (Signed)
Can you see if patient could come in today?

## 2015-05-29 ENCOUNTER — Ambulatory Visit (INDEPENDENT_AMBULATORY_CARE_PROVIDER_SITE_OTHER): Payer: Medicaid Other | Admitting: Family Medicine

## 2015-05-29 ENCOUNTER — Encounter: Payer: Self-pay | Admitting: Family Medicine

## 2015-05-29 VITALS — BP 130/86 | HR 88 | Temp 97.7°F | Resp 18 | Ht 65.0 in | Wt 199.5 lb

## 2015-05-29 DIAGNOSIS — I10 Essential (primary) hypertension: Secondary | ICD-10-CM

## 2015-05-29 DIAGNOSIS — J42 Unspecified chronic bronchitis: Secondary | ICD-10-CM | POA: Diagnosis not present

## 2015-05-29 DIAGNOSIS — M47819 Spondylosis without myelopathy or radiculopathy, site unspecified: Secondary | ICD-10-CM

## 2015-05-29 DIAGNOSIS — E119 Type 2 diabetes mellitus without complications: Secondary | ICD-10-CM

## 2015-05-29 DIAGNOSIS — G8929 Other chronic pain: Secondary | ICD-10-CM

## 2015-05-29 DIAGNOSIS — K219 Gastro-esophageal reflux disease without esophagitis: Secondary | ICD-10-CM | POA: Diagnosis not present

## 2015-05-29 DIAGNOSIS — M797 Fibromyalgia: Secondary | ICD-10-CM | POA: Diagnosis not present

## 2015-05-29 LAB — POCT GLYCOSYLATED HEMOGLOBIN (HGB A1C): HEMOGLOBIN A1C: 6.3

## 2015-05-29 MED ORDER — METFORMIN HCL 850 MG PO TABS: 850.0000 mg | ORAL_TABLET | Freq: Every day | ORAL | Status: DC

## 2015-05-29 MED ORDER — DILTIAZEM HCL ER COATED BEADS 180 MG PO CP24: 180.0000 mg | ORAL_CAPSULE | Freq: Every day | ORAL | Status: DC

## 2015-05-29 MED ORDER — OMEPRAZOLE 20 MG PO CPDR: 20.0000 mg | DELAYED_RELEASE_CAPSULE | Freq: Two times a day (BID) | ORAL | Status: DC

## 2015-05-29 MED ORDER — CHLORTHALIDONE 25 MG PO TABS: 25.0000 mg | ORAL_TABLET | Freq: Every day | ORAL | Status: DC

## 2015-05-29 MED ORDER — ALBUTEROL SULFATE HFA 108 (90 BASE) MCG/ACT IN AERS: 2.0000 | INHALATION_SPRAY | Freq: Four times a day (QID) | RESPIRATORY_TRACT | Status: DC | PRN

## 2015-05-29 MED ORDER — FLUTICASONE-SALMETEROL 100-50 MCG/DOSE IN AEPB: 1.0000 | INHALATION_SPRAY | Freq: Two times a day (BID) | RESPIRATORY_TRACT | Status: DC

## 2015-05-29 MED ORDER — DULOXETINE HCL 30 MG PO CPEP: 30.0000 mg | ORAL_CAPSULE | Freq: Every day | ORAL | Status: DC

## 2015-05-29 NOTE — Patient Instructions (Signed)
Myofascial Pain Syndrome and Fibromyalgia  Myofascial pain syndrome and fibromyalgia are both pain disorders. This pain may be felt mainly in your muscles.   · Myofascial pain syndrome:    Always has trigger points or tender points in the muscle that will cause pain when pressed. The pain may come and go.    Usually affects your neck, upper back, and shoulder areas. The pain often radiates into your arms and hands.  · Fibromyalgia:    Has muscle pains and tenderness that come and go.    Is often associated with fatigue and sleep disturbances.    Has trigger points.    Tends to be long-lasting (chronic), but is not life-threatening.  Fibromyalgia and myofascial pain are not the same. However, they often occur together. If you have both conditions, each can make the other worse. Both are common and can cause enough pain and fatigue to make day-to-day activities difficult.   CAUSES   The exact causes of fibromyalgia and myofascial pain are not known. People with certain gene types may be more likely to develop fibromyalgia. Some factors can be triggers for both conditions, such as:   · Spine disorders.  · Arthritis.  · Severe injury (trauma) and other physical stressors.  · Being under a lot of stress.  · A medical illness.  SIGNS AND SYMPTOMS   Fibromyalgia  The main symptom of fibromyalgia is widespread pain and tenderness in your muscles. This can vary over time. Pain is sometimes described as stabbing, shooting, or burning. You may have tingling or numbness, too. You may also have sleep problems and fatigue. You may wake up feeling tired and groggy (fibro fog). Other symptoms may include:   · Bowel and bladder problems.  · Headaches.  · Visual problems.  · Problems with odors and noises.  · Depression or mood changes.  · Painful menstrual periods (dysmenorrhea).  · Dry skin or eyes.  Myofascial pain syndrome  Symptoms of myofascial pain syndrome include:   · Tight, ropy bands of muscle.    · Uncomfortable  sensations in muscular areas, such as:    Aching.    Cramping.    Burning.    Numbness.    Tingling.      Muscle weakness.  · Trouble moving certain muscles freely (range of motion).  DIAGNOSIS   There are no specific tests to diagnose fibromyalgia or myofascial pain syndrome. Both can be hard to diagnose because their symptoms are common in many other conditions. Your health care provider may suspect one or both of these conditions based on your symptoms and medical history. Your health care provider will also do a physical exam.   The key to diagnosing fibromyalgia is having pain, fatigue, and other symptoms for more than three months that cannot be explained by another condition.   The key to diagnosing myofascial pain syndrome is finding trigger points in muscles that are tender and cause pain elsewhere in your body (referred pain).  TREATMENT   Treating fibromyalgia and myofascial pain often requires a team of health care providers. This usually starts with your primary provider and a physical therapist. You may also find it helpful to work with alternative health care providers, such as massage therapists or acupuncturists.  Treatment for fibromyalgia may include medicines. This may include nonsteroidal anti-inflammatory drugs (NSAIDs), along with other medicines.   Treatment for myofascial pain may also include:  · NSAIDs.  · Cooling and stretching of muscles.  · Trigger point injections.  ·   Sound wave (ultrasound) treatments to stimulate muscles.  HOME CARE INSTRUCTIONS   · Take medicines only as directed by your health care provider.  · Exercise as directed by your health care provider or physical therapist.  · Try to avoid stressful situations.  · Practice relaxation techniques to control your stress. You may want to try:    Biofeedback.    Visual imagery.    Hypnosis.    Muscle relaxation.    Yoga.    Meditation.  · Talk to your health care provider about alternative treatments, such as acupuncture or  massage treatment.  · Maintain a healthy lifestyle. This includes eating a healthy diet and getting enough sleep.  · Consider joining a support group.  · Do not do activities that stress or strain your muscles. That includes repetitive motions and heavy lifting.  SEEK MEDICAL CARE IF:   · You have new symptoms.  · Your symptoms get worse.  · You have side effects from your medicines.  · You have trouble sleeping.  · Your condition is causing depression or anxiety.  FOR MORE INFORMATION   · National Fibromyalgia Association: http://www.fmaware.orgwww.fmaware.org  · Arthritis Foundation: http://www.arthritis.orgwww.arthritis.org  · American Chronic Pain Association: http://www.theacpa.org/condition/myofascial-painwww.theacpa.org/condition/myofascial-pain     This information is not intended to replace advice given to you by your health care provider. Make sure you discuss any questions you have with your health care provider.     Document Released: 08/05/2005 Document Revised: 08/26/2014 Document Reviewed: 05/11/2014  [employer] Interactive Patient Education ©2016 [employer]

## 2015-05-29 NOTE — Progress Notes (Signed)
Name: Kristie Santana   MRN: [id number]    DOB: May 25, 1976   Date:05/29/2015       Progress Note  Subjective  Chief Complaint  Chief Complaint  Patient presents with  . Medication Management    3 month F/U  . Diabetes    Does not check sugar at home  . Hypertension  . Gastrophageal Reflux    Well controlled with medication  . COPD    SOB occasionally due to still smoking  . Pain    Onset- 2 years, Has been going to Dr. Yves Dill informed patient there was nothing else he could do and would need a referal to Pain Clinic due to shots not controlling her back pain, Patient has been to the ER and states the percocet helps and in pain constantly    HPI  Chronic pain: she states she continues to have severe low back pain, had injections by Dr. Council Mechanic without improvement, he advised her to see Pain Management. She has gone to [facility] to get prescriptions of percocet. Explained to her that I will make a referral and can give her some Tramadol, but she states that it will not help with her pain. She is out of Robaxin, but is taking muscle relaxer. She states none of those medications works.   HTN: taking bp medication , no chest pain, bp is at goal   GERD: well controlled with medication, denies side effects of medication  COPD: still smoking, states needs to smoke to control her pain  Metabolic Syndrome: no polyphagia, polydipsia or polyuria, taking Metformin as prescribed   Patient Active Problem List   Diagnosis Date Noted  . Streptococcal pharyngitis 03/20/2015  . Achilles tendinitis 02/22/2015  . CAFL (chronic airflow limitation) (HCC) 02/22/2015  . Benign hypertension 02/22/2015  . Chronic pain 02/22/2015  . Inflammation of sacroiliac joint (HCC) 02/22/2015  . Well controlled diabetes mellitus (HCC) 02/22/2015  . Dyslipidemia 02/22/2015  . Dysmetabolic syndrome 02/22/2015  . H/O abnormal cervical Papanicolaou smear 02/22/2015  . H/O renal calculi 02/22/2015  . History of  methicillin resistant Staphylococcus aureus infection 02/22/2015  . Allergic rhinitis 02/22/2015  . Compulsive tobacco user syndrome 02/22/2015  . 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 Obesity (BMI 30-39.9) 02/22/2015  . Gastro-esophageal reflux disease without esophagitis 09/30/2007    Past Surgical History  Procedure Laterality Date  . Leep    . Cholecystectomy    . Dilation and curettage of uterus    . Tubal ligation      Family History  Problem Relation Age of Onset  . Hypertension Mother   . Hypertension Sister   . Diabetes Sister     Social History   Social History  . Marital Status: Single    Spouse Name: N/A  . Number of Children: N/A  . Years of Education: N/A   Occupational History  . Not on file.   Social History Main Topics  . Smoking status: Current Some Day Smoker -- 1.00 packs/day for 24 years    Types: Cigarettes    Start date: 02/23/1991  . Smokeless tobacco: Never Used  . Alcohol Use: 0.0 oz/week    0 Standard drinks or equivalent per week     Comment: occasionally  . Drug Use: No  . Sexual Activity:    Partners: Male    Birth Control/ Protection: Other-see comments     Comment: Tubal Ligation   Other Topics Concern  . Not on file   Social History Narrative  Current outpatient prescriptions:  .  methocarbamol (ROBAXIN) 750 MG tablet, Take 1 tablet by mouth 3 (three) times daily., Disp: , Rfl:  .  chlorthalidone (HYGROTON) 25 MG tablet, Take 1 tablet (25 mg total) by mouth daily., Disp: 30 tablet, Rfl: 5 .  diazepam (VALIUM) 5 MG tablet, take 1 tablet by mouth 30 MINUTES PRIOR TO PROCEDURE. AND 1 tablet if needed, Disp: , Rfl: 0 .  diltiazem (CARDIZEM CD) 180 MG 24 hr capsule, Take 1 capsule (180 mg total) by mouth at bedtime., Disp: 30 capsule, Rfl: 2 .  Fluticasone-Salmeterol (ADVAIR) 100-50 MCG/DOSE AEPB, Inhale 1 puff into the lungs 2 (two) times daily., Disp: 1 each, Rfl: 3 .  ketorolac (TORADOL) 10 MG tablet, TAKE 1 TABLET BY MOUTH  EVERY 6 HOURS AS NEEDED. (MAX 5 DAYS), Disp: , Rfl: 0 .  loratadine (CLARITIN) 10 MG tablet, Take 1 tablet (10 mg total) by mouth daily., Disp: 30 tablet, Rfl: 5 .  metFORMIN (GLUCOPHAGE) 850 MG tablet, Take 1 tablet (850 mg total) by mouth daily., Disp: 30 tablet, Rfl: 2 .  omeprazole (PRILOSEC) 20 MG capsule, Take 1 capsule (20 mg total) by mouth 2 (two) times daily., Disp: 60 capsule, Rfl: 2 .  PROAIR HFA 108 (90 BASE) MCG/ACT inhaler, inhale 2 puffs by mouth every 4 hours if needed FOR SHORTNESS OF BREATH/WHEEZING, Disp: 8.5 Inhaler, Rfl: 1 .  tiZANidine (ZANAFLEX) 4 MG tablet, take 1 tablet by mouth twice a day if needed, Disp: , Rfl: 0  Allergies  Allergen Reactions  . Ace Inhibitors Cough  . Fish Allergy Itching and Swelling  . Latex   . Penicillins   . Sulfa Antibiotics Hives     ROS  Constitutional: Negative for fever or weight change.  Respiratory: productive  Cough no  shortness of breath.   Cardiovascular: Negative for chest pain or palpitations.  Gastrointestinal: Negative for abdominal pain, no bowel changes.  Musculoskeletal: Negative for gait problem or joint swelling.  Skin: Negative for rash.  Neurological: Negative for dizziness or headache.  No other specific complaints in a complete review of systems (except as listed in HPI above).  Objective  Filed Vitals:   05/29/15 1033  BP: 130/86  Pulse: 88  Temp: 97.7 F (36.5 C)  TempSrc: Oral  Resp: 18  Height: 5\' 5"  (1.651 m)  Weight: 199 lb 8 oz (90.493 kg)  SpO2: 98%    Body mass index is 33.2 kg/(m^2).  Physical Exam  Constitutional: Patient appears well-developed and well-nourished. Mild distress, crying.  Obese Eyes: No scleral icterus. PERL Neck: Normal range of motion. Neck supple. Cardiovascular: Normal rate, regular rhythm and normal heart sounds. No murmur heard. No BLE edema. Pulmonary/Chest: Effort normal and breath sounds normal. No respiratory distress. Abdominal: Soft. There is no  tenderness. Psychiatric: Patient is crying, states she is in pain. behavior is normal. Judgment and thought content normal. Muscular Skeletal: pain during palpation of lumbar spine, negative straight leg raise, decrease in ROM, she also has bilateral knee pain with extension. 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   Recent Results (from the past 2160 hour(s))  POCT HgB A1C     Status: None   Collection Time: 05/29/15 10:56 AM  Result Value Ref Range   Hemoglobin A1C 6.3      PHQ2/9: Depression screen [facility] 2/9 05/29/2015 03/20/2015 02/23/2015  Decreased Interest 0 0 3  Down, Depressed, Hopeless 0 1 1  PHQ - 2 Score 0 1 4  Altered  sleeping - 3 3  Tired, decreased energy - 0 1  Change in appetite - 0 0  Feeling bad or failure about yourself  - 0 1  Trouble concentrating - 0 0  Moving slowly or fidgety/restless - 0 0  Suicidal thoughts - 0 0  PHQ-9 Score - 4 9  Difficult doing work/chores - Not difficult at all Very difficult     Fall Risk: Fall Risk  05/29/2015 03/20/2015 02/23/2015  Falls in the past year? No Yes Yes  Number falls in past yr: - 2 or more 2 or more  Injury with Fall? - Yes Yes  Follow up - Falls evaluation completed -      Functional Status Survey: Is the patient deaf or have difficulty hearing?: No Does the patient have difficulty seeing, even when wearing glasses/contacts?: No Does the patient have difficulty concentrating, remembering, or making decisions?: No Does the patient have difficulty walking or climbing stairs?: No Does the patient have difficulty dressing or bathing?: No Does the patient have difficulty doing errands alone such as visiting a doctor's office or shopping?: No   Assessment & Plan  1. Diabetes mellitus without complication (HCC)  - POCT HgB A1C  2. 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  Spondyloarthropathy  We checked for inflammatory arthritis and was negative in the past, hurts everywhere. Seen by Dr. Council Mechanic but did not respond to injection. We will add Cymbalta, I will not give her narcotics today, follow up in 2 weeks  4. Benign hypertension  Well controlled   5. Gastro-esophageal reflux disease without esophagitis  Stable on medication

## 2015-06-13 ENCOUNTER — Ambulatory Visit: Payer: Medicaid Other | Admitting: Family Medicine

## 2015-06-21 ENCOUNTER — Encounter: Payer: Self-pay | Admitting: Anesthesiology

## 2015-06-21 ENCOUNTER — Ambulatory Visit: Payer: Medicaid Other | Attending: Anesthesiology | Admitting: Anesthesiology

## 2015-06-21 VITALS — BP 119/83 | HR 88 | Temp 98.5°F | Resp 16 | Ht 65.0 in | Wt 197.0 lb

## 2015-06-21 DIAGNOSIS — M549 Dorsalgia, unspecified: Secondary | ICD-10-CM | POA: Diagnosis present

## 2015-06-21 DIAGNOSIS — G8929 Other chronic pain: Secondary | ICD-10-CM | POA: Insufficient documentation

## 2015-06-21 DIAGNOSIS — M545 Low back pain, unspecified: Secondary | ICD-10-CM

## 2015-06-21 DIAGNOSIS — Z720 Tobacco use: Secondary | ICD-10-CM

## 2015-06-21 DIAGNOSIS — K219 Gastro-esophageal reflux disease without esophagitis: Secondary | ICD-10-CM | POA: Diagnosis not present

## 2015-06-21 DIAGNOSIS — I10 Essential (primary) hypertension: Secondary | ICD-10-CM | POA: Diagnosis not present

## 2015-06-21 DIAGNOSIS — M5137 Other intervertebral disc degeneration, lumbosacral region: Secondary | ICD-10-CM

## 2015-06-21 DIAGNOSIS — F17200 Nicotine dependence, unspecified, uncomplicated: Secondary | ICD-10-CM | POA: Diagnosis not present

## 2015-06-21 DIAGNOSIS — M5136 Other intervertebral disc degeneration, lumbar region: Secondary | ICD-10-CM | POA: Diagnosis not present

## 2015-06-21 DIAGNOSIS — M5416 Radiculopathy, lumbar region: Secondary | ICD-10-CM | POA: Diagnosis not present

## 2015-06-21 DIAGNOSIS — E119 Type 2 diabetes mellitus without complications: Secondary | ICD-10-CM | POA: Insufficient documentation

## 2015-06-21 MED ORDER — MELOXICAM 7.5 MG PO TABS: 7.5000 mg | ORAL_TABLET | Freq: Two times a day (BID) | ORAL | Status: DC

## 2015-06-21 NOTE — Progress Notes (Signed)
Safety precautions to be maintained throughout the outpatient stay will include: orient to surroundings, keep bed in low position, maintain call bell within reach at all times, provide assistance with transfer out of bed and ambulation.  

## 2015-06-21 NOTE — Patient Instructions (Signed)
Epidural Steroid Injection Patient Information  Description: The epidural space surrounds the nerves as they exit the spinal cord.  In some patients, the nerves can be compressed and inflamed by a bulging disc or a tight spinal canal (spinal stenosis).  By injecting steroids into the epidural space, we can bring irritated nerves into direct contact with a potentially helpful medication.  These steroids act directly on the irritated nerves and can reduce swelling and inflammation which often leads to decreased pain.  Epidural steroids may be injected anywhere along the spine and from the neck to the low back depending upon the location of your pain.   After numbing the skin with local anesthetic (like Novocaine), a small needle is passed into the epidural space slowly.  You may experience a sensation of pressure while this is being done.  The entire block usually last less than 10 minutes.  Conditions which may be treated by epidural steroids:   Low back and leg pain  Neck and arm pain  Spinal stenosis  Post-laminectomy syndrome  Herpes zoster (shingles) pain  Pain from compression fractures  Preparation for the injection:  1. Do not eat any solid food or dairy products within 6 hours of your appointment.  2. You may drink clear liquids up to 2 hours before appointment.  Clear liquids include water, black coffee, juice or soda.  No milk or cream please. 3. You may take your regular medication, including pain medications, with a sip of water before your appointment  Diabetics should hold regular insulin (if taken separately) and take 1/2 normal NPH dos the morning of the procedure.  Carry some sugar containing items with you to your appointment. 4. A driver must accompany you and be prepared to drive you home after your procedure.  5. Bring all your current medications with your. 6. An IV may be inserted and sedation may be given at the discretion of the physician.   7. A blood pressure  cuff, EKG and other monitors will often be applied during the procedure.  Some patients may need to have extra oxygen administered for a short period. 8. You will be asked to provide medical information, including your allergies, prior to the procedure.  We must know immediately if you are taking blood thinners (like Coumadin/Warfarin)  Or if you are allergic to IV iodine contrast (dye). We must know if you could possible be pregnant.  Possible side-effects:  Bleeding from needle site  Infection (rare, may require surgery)  Nerve injury (rare)  Numbness & tingling (temporary)  Difficulty urinating (rare, temporary)  Spinal headache ( a headache worse with upright posture)  Light -headedness (temporary)  Pain at injection site (several days)  Decreased blood pressure (temporary)  Weakness in arm/leg (temporary)  Pressure sensation in back/neck (temporary)  Call if you experience:  Fever/chills associated with headache or increased back/neck pain.  Headache worsened by an upright position.  New onset weakness or numbness of an extremity below the injection site  Hives or difficulty breathing (go to the emergency room)  Inflammation or drainage at the infection site  Severe back/neck pain  Any new symptoms which are concerning to you  Please note:  Although the local anesthetic injected can often make your back or neck feel good for several hours after the injection, the pain will likely return.  It takes 3-7 days for steroids to work in the epidural space.  You may not notice any pain relief for at least that one week.    If effective, we will often do a series of three injections spaced 3-6 weeks apart to maximally decrease your pain.  After the initial series, we generally will wait several months before considering a repeat injection of the same type.  If you have any questions, please call [phone] [facility] 

## 2015-06-21 NOTE — Progress Notes (Signed)
Subjective:    Patient ID: Kristie Santana, female    DOB: [birth year], 39 y.o.   MRN: [id number] This pleasant delightful 39 year old lady who came in complaining of pain everywhere Patient indicated that she's never had trauma but that she's been having this generalized pain for some time She indicates that the back pain is the most intense and that that pain is confined to the lower lumbar area in the region of L4-L5 \\She  indicates that the pain radiates into both legs but the right leg is more intense  The pain in the right leg radiates to the thigh and into calf and ends up in the right foot  The pain in the left leg radiates from the thigh and ends in the region of the left knee  She has been diagnosed as having said this and degenerative disc disease and also insomnia  Her subjective pain intensity rating is 80%  Her pain is relieved by nothing in particular and it is aggravated by prolonged sitting and standing and lying down   Pain medications  Patient indicates that she has no pain medications at this time   Other medications  Other medications include Claritin and albuterol Hygroton Cymbalta Advair diltiazem metformin and omeprazole   Allergies  Patient is allergic to sulfa and penicillin   Past medical history  As medical history is positive for hypertension diabetes acid reflux and a slipped disc  She said she had an MRI and EMG which confirmed the doses of degenerative disc disease and slipped this   Past surgical history  Past surgical history is positive for cholecystectomy and 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   Social and economic history  Patient indicates that she smokes one pack of cigarettes per day and she's been smoking for the past 20 years she indicates that she drinks 1 or 2 beers per month she says that she does not use illicit drugs but that she uses marijuana marijuana as a teenager    family history patient is single and lives  with her daughter She is para 3+1 and has had 1 miscarriage She has 3 children ages 2322 and 61   she lives with a 36 year old daughter who has ADHD Her mother is alive at age 33 but has hypertension and HIV disease Her father is alive at age 61 but has a cancer that she does not know the location of the cancer She has one brother age 53 is alive and well  She has 2 sisters one is age 34 and she has mental issues and chronic pain while the other one is age 42 and she has hypertension Patient indicates that she is unemployed and she currently gets disability for looking after her daughter who has ADHD       HPI    Review of Systems  Constitutional: Negative for fever, chills, diaphoresis, activity change, appetite change, fatigue and unexpected weight change.  HENT: Negative.  Negative for congestion, dental problem, drooling, ear discharge, ear pain, facial swelling, hearing loss, mouth sores, nosebleeds and postnasal drip.   Eyes: Negative.  Negative for photophobia, pain, discharge, redness, itching and visual disturbance.  Respiratory: Negative for apnea, cough, choking, chest tightness, shortness of breath, wheezing and stridor.   Cardiovascular: Negative.  Negative for chest pain and leg swelling.  Gastrointestinal: Negative.  Negative for nausea, vomiting, abdominal pain, diarrhea, constipation, blood in stool, abdominal distention, anal bleeding and rectal pain.  Endocrine: Negative.  Negative for cold intolerance, heat intolerance, polydipsia, polyphagia and polyuria.  Genitourinary: Positive for vaginal bleeding. Negative for dysuria, urgency, frequency, hematuria, flank pain, decreased urine volume, vaginal discharge, enuresis, difficulty urinating, genital sores, vaginal pain, menstrual problem, pelvic pain and dyspareunia.  Musculoskeletal: Positive for myalgias, back pain, joint swelling, arthralgias and gait problem. Negative for neck pain and neck stiffness.  Skin:  Negative.  Negative for color change, pallor, rash and wound.  Allergic/Immunologic: Negative.  Negative for environmental allergies, food allergies and immunocompromised state.  Neurological: Negative for dizziness, tremors, seizures, syncope, facial asymmetry, speech difficulty, weakness, light-headedness, numbness and headaches.  Hematological: Negative.  Negative for adenopathy. Does not bruise/bleed easily.  Psychiatric/Behavioral: Negative.  Negative for suicidal ideas, hallucinations, behavioral problems, confusion, sleep disturbance, self-injury, dysphoric mood, decreased concentration and agitation. The patient is not nervous/anxious and is not hyperactive.        Objective:   Physical Exam  Constitutional: She is oriented to person, place, and time. She appears well-developed and well-nourished. No distress.  HENT:  Head: Normocephalic and atraumatic.  Right Ear: External ear normal.  Left Ear: External ear normal.  Nose: Nose normal.  Mouth/Throat: Oropharynx is clear and moist. No oropharyngeal exudate.  Eyes: EOM are normal. Pupils are equal, round, and reactive to light. Right eye exhibits no discharge. Left eye exhibits no discharge. No scleral icterus.  Neck: Normal range of motion. Neck supple. No JVD present. No tracheal deviation present. No thyromegaly present.  Cardiovascular: Normal rate, regular rhythm, normal heart sounds and intact distal pulses.  Exam reveals no gallop and no friction rub.   No murmur heard. Pulmonary/Chest: Effort normal and breath sounds normal. No respiratory distress. She has no wheezes. She has no rales. She exhibits no tenderness.  Abdominal: Soft. Bowel sounds are normal. She exhibits no distension and no mass. There is no tenderness. There is no rebound and no guarding.  Genitourinary:  Genitourinary examination was deferred  Musculoskeletal: She exhibits no edema or tenderness.  Torsion test was positive indicative of facetogenic  disease There was tenderness in the paraspinous musculatures in the region of L4-L5 and S1 Straight leg raising test on the right-sided was 50 Straight leg raising test on the left side was 70 Neurological evaluation using light touch and pinprick were unremarkable  Lymphadenopathy:    She has no cervical adenopathy.  Neurological: She is alert and oriented to person, place, and time. She has normal reflexes. She displays normal reflexes. No cranial nerve deficit. She exhibits normal muscle tone. Coordination normal.  Skin: Skin is warm and dry. No rash noted. She is not diaphoretic. No erythema. No pallor.  Psychiatric: She has a normal mood and affect. Her behavior is normal. Judgment and thought content normal.  Nursing note and vitals reviewed.         Assessment & Plan:    Assessment 1Chronic low back pain 2 Lumbar degenerative disc disease  3 right lumbar radiculopathy  4  tobacco abuse  5 status post diabetes mellitus 6 status post hypertension    Plan of management 1 Will plan a caudal epidural steroid injection for this patient next week 2 Will begin her on meloxicam 7.5 mg twice a day after meals 3 I spent a long time advocating a smoking cessation and will continue that approach in the future    New patient      level 4    Tod PersiaWinston Sharelle Burditt M.D. 2 3   New patient  Level   Tod Persia M.D.

## 2015-06-30 ENCOUNTER — Ambulatory Visit: Payer: Medicaid Other | Admitting: Anesthesiology

## 2015-07-07 ENCOUNTER — Ambulatory Visit: Payer: Medicaid Other | Attending: Anesthesiology | Admitting: Anesthesiology

## 2015-07-07 ENCOUNTER — Encounter: Payer: Self-pay | Admitting: Anesthesiology

## 2015-07-07 VITALS — BP 107/63 | HR 77 | Temp 98.0°F | Resp 16 | Ht 65.0 in | Wt 197.0 lb

## 2015-07-07 DIAGNOSIS — M5116 Intervertebral disc disorders with radiculopathy, lumbar region: Secondary | ICD-10-CM | POA: Insufficient documentation

## 2015-07-07 DIAGNOSIS — G545 Neuralgic amyotrophy: Secondary | ICD-10-CM | POA: Diagnosis present

## 2015-07-07 DIAGNOSIS — M51369 Other intervertebral disc degeneration, lumbar region without mention of lumbar back pain or lower extremity pain: Secondary | ICD-10-CM | POA: Insufficient documentation

## 2015-07-07 DIAGNOSIS — G8929 Other chronic pain: Secondary | ICD-10-CM | POA: Insufficient documentation

## 2015-07-07 DIAGNOSIS — M545 Low back pain, unspecified: Secondary | ICD-10-CM

## 2015-07-07 DIAGNOSIS — M5416 Radiculopathy, lumbar region: Secondary | ICD-10-CM | POA: Insufficient documentation

## 2015-07-07 DIAGNOSIS — M5136 Other intervertebral disc degeneration, lumbar region: Secondary | ICD-10-CM

## 2015-07-07 MED ORDER — LIDOCAINE HCL (PF) 1 % IJ SOLN
INTRAMUSCULAR | Status: AC
  Filled 2015-07-07: qty 10

## 2015-07-07 MED ORDER — FENTANYL CITRATE (PF) 100 MCG/2ML IJ SOLN
INTRAMUSCULAR | Status: AC
  Administered 2015-07-07: 50 ug via INTRAVENOUS
  Filled 2015-07-07: qty 2

## 2015-07-07 MED ORDER — BUPIVACAINE HCL (PF) 0.25 % IJ SOLN
INTRAMUSCULAR | Status: AC
  Administered 2015-07-07: 30 mL
  Filled 2015-07-07: qty 30

## 2015-07-07 MED ORDER — MIDAZOLAM HCL 5 MG/5ML IJ SOLN
INTRAMUSCULAR | Status: AC
  Administered 2015-07-07: 2 mg via INTRAVENOUS
  Filled 2015-07-07: qty 5

## 2015-07-07 MED ORDER — IOHEXOL 240 MG/ML SOLN
INTRAMUSCULAR | Status: AC
  Administered 2015-07-07: 5 mL
  Filled 2015-07-07: qty 100

## 2015-07-07 MED ORDER — TRIAMCINOLONE ACETONIDE 40 MG/ML IJ SUSP
INTRAMUSCULAR | Status: AC
  Administered 2015-07-07: 80 mg
  Filled 2015-07-07: qty 2

## 2015-07-07 MED ORDER — SODIUM CHLORIDE 0.9 % IJ SOLN
INTRAMUSCULAR | Status: DC
Start: 2015-07-07 — End: 2015-07-07
  Filled 2015-07-07: qty 20

## 2015-07-07 NOTE — Procedures (Signed)
Date of procedure: 07/07/2015  Preoperative Diagnosis:  1 chronic low back pain 2 lumbar degenerative disc disease 3 lumbar radiculopathy  Postoperative Diagnosis:  Same.  Procedure: 1. Caudal epidural steroid injection, 2. Epidural with interpretation. 3. Fluoroscopic guidance.  Surgeon: Tod PersiaWinston Keirra Zeimet, MD  Anesthesia: MAC anesthesia by the nursing staff under my direction  Informed consent was obtained and the patient appeared to accept and understand the benefits and risks of this procedure.   Pre procedure comments:  Patient was allergic to latex or latex free gloves were used  Description of the Procedure:  The patient was taken to the operating room and placed in the prone position.   Intravenous sedation and MAC anesthesia was administered by the nursing staff under my direction. After appropriate sedation, the sacrococcygeal area was prepped with Betadine.   After adequate draping, the area between the sacral cornu was palpated and infiltrated with 3 cc of 1% Lidocaine.   An AP fluoroscopic view of the sacrum was visualized and a 17 gauge Tuohy needle was inserted in the midline at the angle of 45 degrees through the sacrococcygeal membrane.   After making contact with the bone, the needle was withdrawn and readvanced in horizontal position, into the caudal epidural space.  Epidurogram Study:  One cc of Omnipaque 240 was injected through the needle and epidurogram was visualized in both the later and AP views. After injecting the contrast through the Tuohy needle the dye was observed to spread as high as L3 The spread was very pronounced on the left side while it was present on the right side but not as pronounced  Comments:   This procedure was performed using fluoroscopic guidance Fluoroscopic time was 0.1 minutes Fluoroscopic frames were 2 MG Y was 3.6 No catheter was used   Caudal Epidural Steroid Injection:  Then 10 cc of 0.25% Bupivacaine and 80 mg of  Kenalog were injected into the Caudal epidural space.   The needle was removed and adequate hemostasis was established.    The patient tolerated the procedure quite well and vital signs were stable.  There were no adverse effects.  Additional comments:    The patient was taken to the recovery room in satisfactory condition where the patient was observed and subsequently discharged home.   Will follow up in the clinic in the next week   Tod PersiaWinston Titania Gault M.D.

## 2015-07-07 NOTE — Patient Instructions (Signed)
Pain Management Discharge Instructions  General Discharge Instructions :  If you need to reach your doctor call: Monday-Friday 8:00 am - 4:00 pm at [phone] or toll free [phone].  After clinic hours [phone] to have operator reach doctor.  Bring all of your medication bottles to all your appointments in the pain clinic.  To cancel or reschedule your appointment with Pain Management please remember to call 24 hours in advance to avoid a fee.  Refer to the educational materials which you have been given on: General Risks, I had my Procedure. Discharge Instructions, Post Sedation.  Post Procedure Instructions:  The drugs you were given will stay in your system until tomorrow, so for the next 24 hours you should not drive, make any legal decisions or drink any alcoholic beverages.  You may eat anything you prefer, but it is better to start with liquids then soups and crackers, and gradually work up to solid foods.  Please notify your doctor immediately if you have any unusual bleeding, trouble breathing or pain that is not related to your normal pain.  Depending on the type of procedure that was done, some parts of your body may feel week and/or numb.  This usually clears up by tonight or the next day.  Walk with the use of an assistive device or accompanied by an adult for the 24 hours.  You may use ice on the affected area for the first 24 hours.  Put ice in a Ziploc bag and cover with a towel and place against area 15 minutes on 15 minutes off.  You may switch to heat after 24 hours.Epidural Steroid Injection Patient Information  Description: The epidural space surrounds the nerves as they exit the spinal cord.  In some patients, the nerves can be compressed and inflamed by a bulging disc or a tight spinal canal (spinal stenosis).  By injecting steroids into the epidural space, we can bring irritated nerves into direct contact with a potentially helpful medication.  These  steroids act directly on the irritated nerves and can reduce swelling and inflammation which often leads to decreased pain.  Epidural steroids may be injected anywhere along the spine and from the neck to the low back depending upon the location of your pain.   After numbing the skin with local anesthetic (like Novocaine), a small needle is passed into the epidural space slowly.  You may experience a sensation of pressure while this is being done.  The entire block usually last less than 10 minutes.  Conditions which may be treated by epidural steroids:   Low back and leg pain  Neck and arm pain  Spinal stenosis  Post-laminectomy syndrome  Herpes zoster (shingles) pain  Pain from compression fractures  Preparation for the injection:  1. Do not eat any solid food or dairy products within 6 hours of your appointment.  2. You may drink clear liquids up to 2 hours before appointment.  Clear liquids include water, black coffee, juice or soda.  No milk or cream please. 3. You may take your regular medication, including pain medications, with a sip of water before your appointment  Diabetics should hold regular insulin (if taken separately) and take 1/2 normal NPH dos the morning of the procedure.  Carry some sugar containing items with you to your appointment. 4. A driver must accompany you and be prepared to drive you home after your procedure.  5. Bring all your current medications with your. 6. An IV may be inserted and   sedation may be given at the discretion of the physician.   7. A blood pressure cuff, EKG and other monitors will often be applied during the procedure.  Some patients may need to have extra oxygen administered for a short period. 8. You will be asked to provide medical information, including your allergies, prior to the procedure.  We must know immediately if you are taking blood thinners (like Coumadin/Warfarin)  Or if you are allergic to IV iodine contrast (dye). We must  know if you could possible be pregnant.  Possible side-effects:  Bleeding from needle site  Infection (rare, may require surgery)  Nerve injury (rare)  Numbness & tingling (temporary)  Difficulty urinating (rare, temporary)  Spinal headache ( a headache worse with upright posture)  Light -headedness (temporary)  Pain at injection site (several days)  Decreased blood pressure (temporary)  Weakness in arm/leg (temporary)  Pressure sensation in back/neck (temporary)  Call if you experience:  Fever/chills associated with headache or increased back/neck pain.  Headache worsened by an upright position.  New onset weakness or numbness of an extremity below the injection site  Hives or difficulty breathing (go to the emergency room)  Inflammation or drainage at the infection site  Severe back/neck pain  Any new symptoms which are concerning to you  Please note:  Although the local anesthetic injected can often make your back or neck feel good for several hours after the injection, the pain will likely return.  It takes 3-7 days for steroids to work in the epidural space.  You may not notice any pain relief for at least that one week.  If effective, we will often do a series of three injections spaced 3-6 weeks apart to maximally decrease your pain.  After the initial series, we generally will wait several months before considering a repeat injection of the same type.  If you have any questions, please call [phone] [facility] 

## 2015-07-07 NOTE — Progress Notes (Signed)
Safety precautions to be maintained throughout the outpatient stay will include: orient to surroundings, keep bed in low position, maintain call bell within reach at all times, provide assistance with transfer out of bed and ambulation.  

## 2015-07-10 ENCOUNTER — Telehealth: Payer: Self-pay | Admitting: *Deleted

## 2015-07-10 NOTE — Telephone Encounter (Signed)
Call back complete 

## 2015-07-19 ENCOUNTER — Ambulatory Visit: Payer: Medicaid Other | Admitting: Anesthesiology

## 2015-07-31 ENCOUNTER — Ambulatory Visit: Payer: Medicaid Other | Attending: Anesthesiology | Admitting: Anesthesiology

## 2015-07-31 ENCOUNTER — Encounter: Payer: Self-pay | Admitting: Anesthesiology

## 2015-07-31 VITALS — BP 112/52 | HR 57 | Temp 98.1°F | Resp 16 | Ht 65.0 in | Wt 197.0 lb

## 2015-07-31 DIAGNOSIS — M5116 Intervertebral disc disorders with radiculopathy, lumbar region: Secondary | ICD-10-CM | POA: Insufficient documentation

## 2015-07-31 DIAGNOSIS — M545 Low back pain, unspecified: Secondary | ICD-10-CM

## 2015-07-31 DIAGNOSIS — G8929 Other chronic pain: Secondary | ICD-10-CM | POA: Insufficient documentation

## 2015-07-31 DIAGNOSIS — M5136 Other intervertebral disc degeneration, lumbar region: Secondary | ICD-10-CM

## 2015-07-31 DIAGNOSIS — M47819 Spondylosis without myelopathy or radiculopathy, site unspecified: Secondary | ICD-10-CM

## 2015-07-31 MED ORDER — HYDROCODONE-IBUPROFEN 7.5-200 MG PO TABS: 1.0000 | ORAL_TABLET | Freq: Three times a day (TID) | ORAL | Status: DC | PRN

## 2015-07-31 NOTE — Progress Notes (Signed)
Safety precautions to be maintained throughout the outpatient stay will include: orient to surroundings, keep bed in low position, maintain call bell within reach at all times, provide assistance with transfer out of bed and ambulation.  

## 2015-08-04 NOTE — Progress Notes (Signed)
   Subjective:    Patient ID: Kristie Santana, female    DOB: 03/17/1976, 39 y.o.   MRN: [id number] This patient returned to the clinic today indicating that she got modest improvement from her last procedure which was a caudal epidural steroid injection Today her subjective pain intensity rating is 45% Her activities of daily living have improved following the procedure Thus, she is indicating that she got modest improvement from the procedure   HPI    Review of Systems  Constitutional: Negative.   HENT: Negative.   Eyes: Negative.   Respiratory: Negative.   Cardiovascular: Negative.   Gastrointestinal: Negative.   Endocrine: Negative.   Genitourinary: Negative.   Musculoskeletal: Negative.   Skin: Negative.   Allergic/Immunologic: Negative.   Neurological: Negative.   Hematological: Negative.   Psychiatric/Behavioral: Negative.        Objective:   Physical Exam  Cardiovascular:  This patient appears reasonably well groomed and she is in no distress Her blood pressure is 112/52 mmHg Pulse is 57 bpm Equal and regular Heart sounds 1 and 2 were heard in all areas There were no audible murmurs Temperature was 98.1 degrees Fahrenheit Respirations was 16 breaths per minute SPO2 was 100% Chest is clinically clear There were no adventitious sounds Abdomen is soft and nontender There is no palpable organomegaly There is no significant lymphadenopathy Pupils are equal and reactive Cranial nerves are intact There are no new neurological or musculoskeletal findings  Nursing note and vitals reviewed.         Assessment & Plan:    Assessment 1 chronic low back pain 2  lumbar degenerative disc disease 3 lumbar radiculopathy   Plan of management We'll plan to continue the patient on Vicoproten 7.5/500 one tablet 3 times a day after meals and will give her 50 tablets We'll follow-up with this patient in 1 mont   Established patient    Level III   Tod PersiaWinston  Rafel Garde M.D.

## 2015-08-30 ENCOUNTER — Ambulatory Visit: Payer: Medicaid Other | Attending: Anesthesiology | Admitting: Anesthesiology

## 2015-08-30 ENCOUNTER — Other Ambulatory Visit: Payer: Self-pay | Admitting: Anesthesiology

## 2015-08-30 ENCOUNTER — Encounter: Payer: Self-pay | Admitting: Anesthesiology

## 2015-08-30 VITALS — BP 109/72 | HR 74 | Temp 98.2°F | Resp 18 | Ht 65.0 in | Wt 200.0 lb

## 2015-08-30 DIAGNOSIS — M5416 Radiculopathy, lumbar region: Secondary | ICD-10-CM

## 2015-08-30 DIAGNOSIS — M5136 Other intervertebral disc degeneration, lumbar region: Secondary | ICD-10-CM

## 2015-08-30 DIAGNOSIS — M5116 Intervertebral disc disorders with radiculopathy, lumbar region: Secondary | ICD-10-CM | POA: Insufficient documentation

## 2015-08-30 DIAGNOSIS — M545 Low back pain, unspecified: Secondary | ICD-10-CM

## 2015-08-30 DIAGNOSIS — G8929 Other chronic pain: Secondary | ICD-10-CM | POA: Insufficient documentation

## 2015-08-30 MED ORDER — IOHEXOL 180 MG/ML  SOLN
20.0000 mL | INTRAMUSCULAR | Status: AC | PRN
  Administered 2015-08-30: 09:00:00 via INTRAVENOUS

## 2015-08-30 MED ORDER — TRIAMCINOLONE ACETONIDE 40 MG/ML IJ SUSP
80.0000 mg | Freq: Once | INTRAMUSCULAR | Status: AC
  Administered 2015-08-30 (×2): via INTRAMUSCULAR

## 2015-08-30 MED ORDER — MIDAZOLAM HCL 5 MG/5ML IJ SOLN
INTRAMUSCULAR | Status: AC
  Administered 2015-08-30: 2 mg via INTRAVENOUS
  Filled 2015-08-30: qty 5

## 2015-08-30 MED ORDER — HYDROCODONE-IBUPROFEN 7.5-200 MG PO TABS: 1.0000 | ORAL_TABLET | Freq: Two times a day (BID) | ORAL | Status: DC

## 2015-08-30 MED ORDER — BUPIVACAINE HCL (PF) 0.25 % IJ SOLN
INTRAMUSCULAR | Status: AC
  Administered 2015-08-30: 20 mL
  Filled 2015-08-30: qty 30

## 2015-08-30 MED ORDER — IOHEXOL 180 MG/ML  SOLN
INTRAMUSCULAR | Status: AC
  Filled 2015-08-30: qty 20

## 2015-08-30 MED ORDER — TRIAMCINOLONE ACETONIDE 40 MG/ML IJ SUSP
INTRAMUSCULAR | Status: AC
  Filled 2015-08-30: qty 1

## 2015-08-30 MED ORDER — BUPIVACAINE HCL (PF) 0.25 % IJ SOLN
20.0000 mL | Freq: Once | INTRAMUSCULAR | Status: AC
  Administered 2015-08-30: 20 mL

## 2015-08-30 MED ORDER — MIDAZOLAM HCL 5 MG/5ML IJ SOLN
1.0000 mg | INTRAMUSCULAR | Status: AC
  Administered 2015-08-30: 2 mg via INTRAVENOUS

## 2015-08-30 NOTE — Procedures (Signed)
Date of procedure: 08/30/2015  Preoperative Diagnosis:  1 chronic low back pain 2 lumbar degenerative disc disease 3 lumbar radiculopathy  Postoperative Diagnosis:  Same.  Procedure: 1. Caudal epidural steroid injection, 2. Epidural with interpretation. 3. Fluoroscopic guidance.  Surgeon: Tod PersiaWinston Sharda Keddy, MD  Anesthesia: MAC anesthesia by the nursing staff under my direction  Informed consent was obtained and the patient appeared to accept and understand the benefits and risks of this procedure.   Pre procedure comments:  Patient had breakfast about 3 hours ago and I will defer using fentanyl from her periprocedural sedation  Description of the Procedure:  The patient was taken to the operating room and placed in the prone position.  Intravenous sedation and MAC anesthesia was administered by the nursing staff under my direction. After appropriate sedation, the sacrococcygeal area was prepped with Betadine.  After adequate draping, the area between the sacral cornu was palpated and infiltrated with 3 cc of 1% Lidocaine.   An AP fluoroscopic view of the sacrum was visualized and a 17 gauge Tuohy needle was inserted in the midline at the angle of 45 degrees through the sacrococcygeal membrane.  After making contact with the bone, the needle was withdrawn and readvanced in horizontal position, into the caudal epidural space.  Epidurogram Study: One cc of Omnipaque 180 was injected through the needle and epidurogram was visualized in both the later and AP views. After injecting the Omnipaque 180 through the Tuohy needle the dye was observed to spread cephalad as far as L4 The spread was symmetrical on both sides with a little better clarity on the right The spread was observed to go through L4-L5 and S1 No catheter was used  Caudal Epidural Steroid Injection:  Then 10 cc of 0.25% Bupivacaine and 80 mg of Kenalog were injected into the Caudal epidural space.   The needle was  removed and adequate hemostasis was established.    The patient tolerated the procedure quite well and vital signs were stable.   There were no adverse effects.  Additional comments:   This procedure was done using fluoroscopic guidance Fluoroscopic time was 0.1 minutes There were 2 fluoroscopic views MG Y was 5.3  The patient was taken to the recovery room in satisfactory condition where the patient was observed and subsequently discharged home.  The patient was given Vicoprofen 7.5/200 mg 1 twice a day after meals and she was given 60 tablets  Will follow up in the clinic in the next month.  Tod PersiaWinston Coby Antrobus M.D.

## 2015-08-30 NOTE — Progress Notes (Signed)
Safety precautions to be maintained throughout the outpatient stay will include: orient to surroundings, keep bed in low position, maintain call bell within reach at all times, provide assistance with transfer out of bed and ambulation.  

## 2015-08-31 ENCOUNTER — Ambulatory Visit (INDEPENDENT_AMBULATORY_CARE_PROVIDER_SITE_OTHER): Payer: Medicaid Other | Admitting: Family Medicine

## 2015-08-31 ENCOUNTER — Encounter: Payer: Self-pay | Admitting: Family Medicine

## 2015-08-31 VITALS — BP 102/64 | HR 75 | Temp 97.5°F | Resp 12 | Wt 204.4 lb

## 2015-08-31 DIAGNOSIS — J45909 Unspecified asthma, uncomplicated: Secondary | ICD-10-CM

## 2015-08-31 DIAGNOSIS — J42 Unspecified chronic bronchitis: Secondary | ICD-10-CM

## 2015-08-31 DIAGNOSIS — N760 Acute vaginitis: Secondary | ICD-10-CM | POA: Diagnosis not present

## 2015-08-31 DIAGNOSIS — E119 Type 2 diabetes mellitus without complications: Secondary | ICD-10-CM | POA: Diagnosis not present

## 2015-08-31 DIAGNOSIS — I10 Essential (primary) hypertension: Secondary | ICD-10-CM | POA: Diagnosis not present

## 2015-08-31 DIAGNOSIS — G8929 Other chronic pain: Secondary | ICD-10-CM | POA: Diagnosis not present

## 2015-08-31 DIAGNOSIS — J3089 Other allergic rhinitis: Secondary | ICD-10-CM

## 2015-08-31 DIAGNOSIS — R252 Cramp and spasm: Secondary | ICD-10-CM | POA: Diagnosis not present

## 2015-08-31 DIAGNOSIS — J449 Chronic obstructive pulmonary disease, unspecified: Secondary | ICD-10-CM | POA: Diagnosis not present

## 2015-08-31 DIAGNOSIS — M797 Fibromyalgia: Secondary | ICD-10-CM

## 2015-08-31 DIAGNOSIS — Z23 Encounter for immunization: Secondary | ICD-10-CM | POA: Diagnosis not present

## 2015-08-31 DIAGNOSIS — R350 Frequency of micturition: Secondary | ICD-10-CM | POA: Diagnosis not present

## 2015-08-31 DIAGNOSIS — E785 Hyperlipidemia, unspecified: Secondary | ICD-10-CM | POA: Diagnosis not present

## 2015-08-31 DIAGNOSIS — Z113 Encounter for screening for infections with a predominantly sexual mode of transmission: Secondary | ICD-10-CM | POA: Diagnosis not present

## 2015-08-31 DIAGNOSIS — J309 Allergic rhinitis, unspecified: Secondary | ICD-10-CM | POA: Diagnosis not present

## 2015-08-31 DIAGNOSIS — J4489 Other specified chronic obstructive pulmonary disease: Secondary | ICD-10-CM

## 2015-08-31 LAB — POCT URINALYSIS DIPSTICK
BILIRUBIN UA: NEGATIVE
Glucose, UA: 1000
KETONES UA: NEGATIVE
Leukocytes, UA: NEGATIVE
Nitrite, UA: NEGATIVE
PH UA: 6.5
RBC UA: NEGATIVE
SPEC GRAV UA: 1.025
Urobilinogen, UA: NEGATIVE

## 2015-08-31 LAB — POCT WET PREP (WET MOUNT)

## 2015-08-31 LAB — POCT GLYCOSYLATED HEMOGLOBIN (HGB A1C): HEMOGLOBIN A1C: 6.5

## 2015-08-31 MED ORDER — CLINDAMYCIN PHOSPHATE 2 % VA CREA: 1.0000 | TOPICAL_CREAM | Freq: Every day | VAGINAL | Status: DC

## 2015-08-31 MED ORDER — DILTIAZEM HCL ER COATED BEADS 120 MG PO CP24: 120.0000 mg | ORAL_CAPSULE | Freq: Every day | ORAL | Status: DC

## 2015-08-31 MED ORDER — DULOXETINE HCL 30 MG PO CPEP: 30.0000 mg | ORAL_CAPSULE | Freq: Every day | ORAL | Status: DC

## 2015-08-31 MED ORDER — ALBUTEROL SULFATE HFA 108 (90 BASE) MCG/ACT IN AERS: 2.0000 | INHALATION_SPRAY | Freq: Four times a day (QID) | RESPIRATORY_TRACT | Status: DC | PRN

## 2015-08-31 MED ORDER — FLUTICASONE-SALMETEROL 100-50 MCG/DOSE IN AEPB: 1.0000 | INHALATION_SPRAY | Freq: Two times a day (BID) | RESPIRATORY_TRACT | Status: DC

## 2015-08-31 MED ORDER — LORATADINE 10 MG PO TABS: 10.0000 mg | ORAL_TABLET | Freq: Every day | ORAL | Status: AC

## 2015-08-31 NOTE — Progress Notes (Signed)
Name: Kristie Santana   MRN: [id number]    DOB: October 02, 1975   Date:08/31/2015       Progress Note  Subjective  Chief Complaint  Chief Complaint  Patient presents with  . Medication Refill  . Vaginal Discharge    patient stated that she has been having some discharge and it has a foul odor  . Blood Pressure Check    patient stated that her blood pressure has been running too low and she has been having some dizziness.    HPI  Chronic pain:she is now at the pain clinic and is getting Vicoprofen, she has low back pain and had epidural injections but does not seem to be helping. She had a second dose yesterday. Pain level today is 4/10, described as aching and sharp at times, radiates down both legs.   HTN: taking bp medication , she has noticed some cramping, she has also noticed dizziness/lightheaded. BP is low today.   GERD: well controlled with medication, denies side effects of medication. No heartburn no regurgitation.   COPD: still smoking, she still has a productive cough usually in am's, no wheezing or SOB. Using Advair daily and it helps with her symptoms.   DM: no polyphagia, polydipsia or polyuria, taking Metformin as prescribed, she is due for an eye exam, she refused Pneumonia vaccine.   AR: controlled with medication  FMS: she aches all over, she has mental fogginess at times. She states pain gets worse when she is more active. She never started taking Cymbalta, but is willing to take it now. Discussed again possible side effects  Vaginal Discharge: she was dating for 6 years, broke up 6 months ago and had intercourse with him again one month ago, since that time she has noticed vaginal odor and discharge, no itching  Urinary frequency: she has noticed urinary frequency and supra pubic for the past month, no dysuria.   Patient Active Problem List   Diagnosis Date Noted  . Back pain at L4-L5 level 07/07/2015  . DDD (degenerative disc disease), lumbar 07/07/2015  . Lumbar  radiculopathy 07/07/2015  . Streptococcal pharyngitis 03/20/2015  . Achilles tendinitis 02/22/2015  . CAFL (chronic airflow limitation) (HCC) 02/22/2015  . Benign hypertension 02/22/2015  . Chronic pain 02/22/2015  . Inflammation of sacroiliac joint (HCC) 02/22/2015  . Well controlled diabetes mellitus (HCC) 02/22/2015  . Dyslipidemia 02/22/2015  . Dysmetabolic syndrome 02/22/2015  . H/O abnormal cervical Papanicolaou smear 02/22/2015  . H/O renal calculi 02/22/2015  . History of methicillin resistant Staphylococcus aureus infection 02/22/2015  . Allergic rhinitis 02/22/2015  . Compulsive tobacco user syndrome 02/22/2015  . 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 Obesity (BMI 30-39.9) 02/22/2015  . Gastro-esophageal reflux disease without esophagitis 09/30/2007    Past Surgical History  Procedure Laterality Date  . Leep    . Cholecystectomy    . Dilation and curettage of uterus    . Tubal ligation      Family History  Problem Relation Age of Onset  . Hypertension Mother   . Hypertension Sister   . Diabetes Sister     Social History   Social History  . Marital Status: Single    Spouse Name: N/A  . Number of Children: N/A  . Years of Education: N/A   Occupational History  . Not on file.   Social History Main Topics  . Smoking status: Current Some Day Smoker -- 1.00 packs/day for 24 years    Types: Cigarettes    Start date: 02/23/1991  .  Smokeless tobacco: Never Used  . Alcohol Use: 0.0 oz/week    0 Standard drinks or equivalent per week     Comment: occasionally  . Drug Use: No  . Sexual Activity:    Partners: Male    Birth Control/ Protection: Other-see comments     Comment: Tubal Ligation   Other Topics Concern  . Not on file   Social History Narrative     Current outpatient prescriptions:  .  albuterol (PROAIR HFA) 108 (90 Base) MCG/ACT inhaler, Inhale 2 puffs into the lungs every 6 (six) hours as needed for wheezing or shortness of breath., Disp: 8.5  Inhaler, Rfl: 0 .  chlorthalidone (HYGROTON) 25 MG tablet, Take 1 tablet (25 mg total) by mouth daily., Disp: 30 tablet, Rfl: 5 .  DULoxetine (CYMBALTA) 30 MG capsule, Take 1 capsule (30 mg total) by mouth daily. For the first week after that 2 daily, Disp: 60 capsule, Rfl: 0 .  Fluticasone-Salmeterol (ADVAIR) 100-50 MCG/DOSE AEPB, Inhale 1 puff into the lungs 2 (two) times daily., Disp: 1 each, Rfl: 3 .  HYDROcodone-ibuprofen (VICOPROFEN) 7.5-200 MG tablet, Take 1 tablet by mouth 2 (two) times daily after a meal., Disp: 60 tablet, Rfl: 0 .  loratadine (CLARITIN) 10 MG tablet, Take 1 tablet (10 mg total) by mouth daily., Disp: 30 tablet, Rfl: 5 .  meloxicam (MOBIC) 7.5 MG tablet, take 1 tablet by mouth twice a day AFTER A MEAL, Disp: , Rfl: 0 .  metFORMIN (GLUCOPHAGE) 850 MG tablet, Take 1 tablet (850 mg total) by mouth daily., Disp: 30 tablet, Rfl: 5 .  omeprazole (PRILOSEC) 20 MG capsule, Take 1 capsule (20 mg total) by mouth 2 (two) times daily., Disp: 60 capsule, Rfl: 5 .  diltiazem (CARDIZEM CD) 120 MG 24 hr capsule, Take 1 capsule (120 mg total) by mouth daily., Disp: 30 capsule, Rfl: 2  Current facility-administered medications:  .  midazolam (VERSED) 5 MG/5ML injection 1 mg, 1 mg, Intravenous, UD, Tod Persia, MD, 2 mg at 08/30/15 1914  Allergies  Allergen Reactions  . Ace Inhibitors Cough  . Fish Allergy Itching and Swelling  . Latex   . Penicillins   . Sulfa Antibiotics Hives     ROS  Constitutional: Negative for fever , positive for weight change.  Respiratory: Positive for intermittent cough and shortness of breath.   Cardiovascular: Negative for chest pain or palpitations.  Gastrointestinal: Negative for abdominal pain, no bowel changes ( doing well on Metformin - helping the constipation).  Musculoskeletal: Negative for gait problem or joint swelling.  Skin: Negative for rash.  Neurological: Positive  for dizziness no headache.  No other specific complaints in a  complete review of systems (except as listed in HPI above).  Objective  Filed Vitals:   08/31/15 0757  BP: 102/64  Pulse: 75  Temp: 97.5 F (36.4 C)  TempSrc: Oral  Resp: 12  Weight: 204 lb 6.4 oz (92.715 kg)  SpO2: 97%    Body mass index is 34.01 kg/(m^2).  Physical Exam  Constitutional: Patient appears well-developed and well-nourished. Obese No distress.  HEENT: head atraumatic, normocephalic, pupils equal and reactive to light,  neck supple, throat within normal limits Cardiovascular: Normal rate, regular rhythm and normal heart sounds.  No murmur heard. No BLE edema. Pulmonary/Chest: Effort normal and breath sounds normal. No respiratory distress. Abdominal: Soft.  There is no tenderness. Psychiatric: Patient has a normal mood and affect. behavior is normal. Judgment and thought content normal. Muscular Skeletal: pain during palpation  of lumbar spine, negative straight leg raise, Trigger points positive throughout.  GYN: vaginal discharge , mild and white in color, normal cervix, mild bimanual motion tenderness  PHQ2/9: [facility] 08/31/2015 08/30/2015 07/31/2015 07/07/2015 06/21/2015  Decreased Interest 0 0 0 0 0  Down, Depressed, Hopeless 0 0 0 0 0  PHQ - 2 Score 0 0 0 0 0  Altered sleeping - - - - -  Tired, decreased energy - - - - -  Change in appetite - - - - -  Feeling bad or failure about yourself  - - - - -  Trouble concentrating - - - - -  Moving slowly or fidgety/restless - - - - -  Suicidal thoughts - - - - -  PHQ-9 Score - - - - -  Difficult doing work/chores - - - - -     Fall Risk: Fall Risk  08/31/2015 08/30/2015 07/31/2015 07/07/2015 06/21/2015  Falls in the past year? No No No No Yes  Number falls in past yr: - - - - 1  Injury with Fall? - - - - No  Follow up - - - - Falls evaluation completed      Functional Status Survey: Is the patient deaf or have difficulty hearing?: No Does the patient have difficulty seeing, even when  wearing glasses/contacts?: No Does the patient have difficulty concentrating, remembering, or making decisions?: No Does the patient have difficulty walking or climbing stairs?: Yes (due to fibromyalgia) Does the patient have difficulty dressing or bathing?: No Does the patient have difficulty doing errands alone such as visiting a doctor's office or shopping?: No    Assessment & Plan  1. Diabetes mellitus without complication (HCC)  She needs to re-schedule eye exam, referral made on her last visit, refusing PCV  2. Chronic pain  Continue follow up at the pain clinic, explained vicoprofen may cause worsening of GERD symptoms, but she states under control at this time  3. Fibromyalgia  Try Duloxetine, sending another prescription to her pharmacy - DULoxetine (CYMBALTA) 30 MG capsule; Take 1 capsule (30 mg total) by mouth daily. For the first week after that 2 daily  Dispense: 60 capsule; Refill: 0  4. Benign hypertension  We will decrease dose of Cardizem and also check potassium level  - diltiazem (CARDIZEM CD) 120 MG 24 hr capsule; Take 1 capsule (120 mg total) by mouth daily.  Dispense: 30 capsule; Refill: 2  5. Dyslipidemia  Recheck labs in April   6. Routine screening for STI (sexually transmitted infection)  - Chlamydia/Gonococcus/Trichomonas, NAA - RPR - HIV antibody  7. Vaginitis  - POCT Wet Prep (Wet [place]) _Cleocin vaginal for 7 days  8. Chronic bronchitis, unspecified chronic bronchitis type (HCC)  Spirometry today  - Fluticasone-Salmeterol (ADVAIR) 100-50 MCG/DOSE AEPB; Inhale 1 puff into the lungs 2 (two) times daily.  Dispense: 1 each; Refill: 3 - albuterol (PROAIR HFA) 108 (90 Base) MCG/ACT inhaler; Inhale 2 puffs into the lungs every 6 (six) hours as needed for wheezing or shortness of breath.  Dispense: 8.5 Inhaler; Refill: 0  9. Perennial allergic rhinitis  - loratadine (CLARITIN) 10 MG tablet; Take 1 tablet (10 mg total) by mouth daily.  Dispense:  30 tablet; Refill: 5  10. Need for vaccination for Strep pneumoniae  - Pneumococcal polysaccharide vaccine 23-valent greater than or equal to 2yo subcutaneous/IM -refused   11. Needs flu shot  - Flu Vaccine QUAD 36+ mos IM -refused  12. Cramping of hands  -  Potassium - Magnesium   13. Urinary frequency  - POCT Urinalysis Dipstick - Urine Culture

## 2015-09-01 LAB — URINE CULTURE

## 2015-09-08 LAB — TOXASSURE SELECT 13 (MW), URINE: PDF: 0

## 2015-09-09 LAB — CHLAMYDIA/GONOCOCCUS/TRICHOMONAS, NAA
CHLAMYDIA BY NAA: NEGATIVE
Gonococcus by NAA: NEGATIVE
Trich vag by NAA: NEGATIVE

## 2015-09-26 ENCOUNTER — Encounter: Payer: Self-pay | Admitting: Anesthesiology

## 2015-09-26 ENCOUNTER — Other Ambulatory Visit: Payer: Self-pay | Admitting: Anesthesiology

## 2015-09-26 ENCOUNTER — Telehealth: Payer: Self-pay | Admitting: Family Medicine

## 2015-09-26 ENCOUNTER — Ambulatory Visit: Payer: Medicaid Other | Attending: Anesthesiology | Admitting: Anesthesiology

## 2015-09-26 VITALS — BP 137/84 | HR 93 | Temp 97.5°F | Ht 65.0 in | Wt 204.0 lb

## 2015-09-26 DIAGNOSIS — M5116 Intervertebral disc disorders with radiculopathy, lumbar region: Secondary | ICD-10-CM | POA: Diagnosis not present

## 2015-09-26 DIAGNOSIS — M545 Low back pain, unspecified: Secondary | ICD-10-CM

## 2015-09-26 DIAGNOSIS — M5416 Radiculopathy, lumbar region: Secondary | ICD-10-CM

## 2015-09-26 DIAGNOSIS — G8929 Other chronic pain: Secondary | ICD-10-CM | POA: Diagnosis not present

## 2015-09-26 DIAGNOSIS — I10 Essential (primary) hypertension: Secondary | ICD-10-CM

## 2015-09-26 DIAGNOSIS — M5136 Other intervertebral disc degeneration, lumbar region: Secondary | ICD-10-CM

## 2015-09-26 MED ORDER — DILTIAZEM HCL ER COATED BEADS 180 MG PO TB24: 180.0000 mg | ORAL_TABLET | Freq: Every day | ORAL | Status: DC

## 2015-09-26 MED ORDER — GABAPENTIN 100 MG PO CAPS: 100.0000 mg | ORAL_CAPSULE | Freq: Three times a day (TID) | ORAL | Status: DC

## 2015-09-26 MED ORDER — HYDROCODONE-ACETAMINOPHEN 7.5-325 MG/15ML PO SOLN
15.0000 mL | Freq: Two times a day (BID) | ORAL | Status: DC | PRN
Start: 2015-09-26 — End: 2015-09-29

## 2015-09-26 NOTE — Telephone Encounter (Signed)
Patient states she is getting more headaches since you have lower her blood pressure dose on the Diltiazem 180 mg to 120 mg. Patient had her BP while at the pain clinic today and stays it has been running 137/84 and she has been having constant headaches since decreasing dose.

## 2015-09-26 NOTE — Patient Instructions (Signed)
You were given a prescription for Hydrocodone A script for Neurontin was sent to your pharmacy.

## 2015-09-26 NOTE — Telephone Encounter (Signed)
Pt has some concerns about her BP and the meds she is on. Pt would like a call back.

## 2015-09-26 NOTE — Telephone Encounter (Signed)
Changed back to 180

## 2015-09-26 NOTE — Progress Notes (Signed)
   Subjective:    Patient ID: Kristie Santana, female    DOB: June 08, 1976, 40 y.o.   MRN: [id number]  HPI  This patient indicated that she did not obtained any pain relief from the caudal epidural steroid injection She had some major stress in the past 2 days since she was in the delivery room with her daughter while she was having a baby Today her subjective pain intensity  Rating is 80% She indicates that she obtained  Significant rashes from the Vicoprofen She is not interested in any further  Nerve blocks I would also discontinue her Vicoprofen  Review of Systems  Constitutional: Negative.   HENT: Negative.   Eyes: Negative.   Respiratory: Negative.   Cardiovascular: Negative.   Gastrointestinal: Negative.   Endocrine: Negative.   Genitourinary: Negative.   Musculoskeletal: Positive for myalgias, back pain, arthralgias and gait problem. Negative for joint swelling, neck pain and neck stiffness.  Skin: Negative.   Allergic/Immunologic: Negative.   Neurological: Negative.   Hematological: Negative.   Psychiatric/Behavioral: Negative.        Objective:   Physical Exam  Cardiovascular:  This patient appears somewhat disheveled and has a Sullen attitude Her blood pressure is 137/84 mmHg Her pulse is 93 bpm Equal and regular Heart sounds 1 and 2 were heard in all areas There were no audible murmurs Temperature is 97.37F Respirations are 16 breaths per minute SPO2 was 100% Chest is clinically clear There are no adventitious sounds Abdomen is soft and nontender There are no palpable organomegaly There are no significant lymphadenopathy Pupils are equal and reactive Cranial nerves are intact There are no new neurological normal musculoske  Nursing note and vitals reviewed.         Assessment & Plan:   Assessment 1 chronic low back pain 2 lumbar degenerative disc disease 3 lumbar radiculopathy   Plan of management 1 we'll discontinue Vicoprofen 2 will begin  gabapentin 100 mg 3 times a day and Give 90 tablets 3 we will begin Vicodin 5/325 n tablet twice a day when necessary and give 30 tablet For will follow up with her in 1 m    Established patient      Level III    Tod Persia M.D.

## 2015-09-26 NOTE — Progress Notes (Signed)
Safety precautions to be maintained throughout the outpatient stay will include: orient to surroundings, keep bed in low position, maintain call bell within reach at all times, provide assistance with transfer out of bed and ambulation.  

## 2015-09-26 NOTE — Progress Notes (Signed)
   Subjective:    Patient ID: Kristie Santana, female    DOB: [birth year], 40 y.o.   MRN: [id number]  HPI    Review of Systems     Objective:   Physical Exam        Assessment & Plan:

## 2015-09-27 ENCOUNTER — Telehealth: Payer: Self-pay | Admitting: Anesthesiology

## 2015-09-27 NOTE — Telephone Encounter (Signed)
Patient states she only got enough meds for 2 days not 10, please call asap

## 2015-09-28 NOTE — Telephone Encounter (Signed)
Dr. Starling Manns notified, will correct the dose.

## 2015-09-29 MED ORDER — HYDROCODONE-ACETAMINOPHEN 5-325 MG PO TABS: 1.0000 | ORAL_TABLET | Freq: Two times a day (BID) | ORAL | Status: DC

## 2015-09-29 NOTE — Progress Notes (Signed)
   Subjective:    Patient ID: Kristie Santana, female    DOB: 30-Mar-1976, 40 y.o.   MRN: [id number]  HPI    Review of Systems     Objective:   Physical Exam        Assessment & Plan:  Patient was given Vicodin 5/325 mg 1 tablet twice a day and was given 45 tablets We'll follow-up with her in 1 month  Tod Persia M.D.

## 2015-09-29 NOTE — Addendum Note (Signed)
Addended by: Tod Persia on: 09/29/2015 01:12 PM   Modules accepted: Orders, Medications

## 2015-10-01 LAB — TOXASSURE SELECT 13 (MW), URINE: PDF: 0

## 2015-10-05 ENCOUNTER — Telehealth: Payer: Self-pay | Admitting: Family Medicine

## 2015-10-05 NOTE — Telephone Encounter (Signed)
Needs follow-up

## 2015-10-05 NOTE — Telephone Encounter (Signed)
Requesting return call patient has not had a menstrual since December and stated that her tubes are tied. Would like to know if an appointment is needed.

## 2015-10-05 NOTE — Telephone Encounter (Signed)
LMOM to call the office and schedule an appt.

## 2015-10-23 ENCOUNTER — Ambulatory Visit: Payer: Medicaid Other | Attending: Anesthesiology | Admitting: Anesthesiology

## 2015-10-23 ENCOUNTER — Encounter: Payer: Self-pay | Admitting: Anesthesiology

## 2015-10-23 VITALS — BP 131/74 | HR 103 | Temp 98.5°F | Resp 16 | Ht 65.0 in | Wt 205.0 lb

## 2015-10-23 DIAGNOSIS — M5416 Radiculopathy, lumbar region: Secondary | ICD-10-CM

## 2015-10-23 DIAGNOSIS — M545 Low back pain, unspecified: Secondary | ICD-10-CM

## 2015-10-23 DIAGNOSIS — M5116 Intervertebral disc disorders with radiculopathy, lumbar region: Secondary | ICD-10-CM | POA: Diagnosis not present

## 2015-10-23 DIAGNOSIS — G8929 Other chronic pain: Secondary | ICD-10-CM | POA: Diagnosis not present

## 2015-10-23 DIAGNOSIS — M5136 Other intervertebral disc degeneration, lumbar region: Secondary | ICD-10-CM

## 2015-10-23 MED ORDER — HYDROCODONE-ACETAMINOPHEN 5-325 MG PO TABS: 1.0000 | ORAL_TABLET | Freq: Two times a day (BID) | ORAL | Status: DC

## 2015-10-23 NOTE — Patient Instructions (Signed)
You were given a prescription for Hydrocodone today 

## 2015-10-23 NOTE — Progress Notes (Signed)
Safety precautions to be maintained throughout the outpatient stay will include: orient to surroundings, keep bed in low position, maintain call bell within reach at all times, provide assistance with transfer out of bed and ambulation.  

## 2015-10-23 NOTE — Progress Notes (Signed)
   Subjective:    Patient ID: Kristie MouldingBonita K Santana, female    DOB: 12/22/1975, 40 y.o.   MRN: [id number]  HPI  This patient returned to the clinic today indicating that she is feeling much better than she has been feeling in a long time. Her pain is much better controlled Her subjective pain intensity rating today is 45% He says she sleeps much better Her general attitude and demeanor are significantly improved Her back pain while still present is manageable with the help of the hydrocodone/acetaminophen She also takes to gabapentin 3 times a day and that gives her tremendous relief too She insisted that this is the best she is felt in a very long time  Review of Systems  Constitutional: Negative.   HENT: Negative.   Eyes: Negative.   Respiratory: Negative.   Cardiovascular: Negative.   Gastrointestinal: Negative.   Endocrine: Negative.   Genitourinary: Negative.   Musculoskeletal: Positive for myalgias, back pain, arthralgias and gait problem. Negative for joint swelling, neck pain and neck stiffness.  Skin: Negative.   Allergic/Immunologic: Negative.   Neurological: Negative.   Hematological: Negative.   Psychiatric/Behavioral: Negative.        Objective:   Physical Exam  Cardiovascular:  This patient appears to be in excellent spirits The pain appears to be significantly better controlled Her subjective pain intensity rating is about 45% Her blood pressure is 131/74 mmHg Her pulse is 103 bpm Equal and regular Heart sounds 1 and 2 were heard in all areas There were no audible murmurs Temperature is 98.25F Respirations are 16 breaths per minute SPO2 was 100% Chest is clinically clear There are no adventitious sounds Abdomen is soft and nontender There is no palpable organomegaly There is no significant lymphadenopathy There is some tenderness in the L4-L5 area of the paraspinous muscles with more tenderness on the right than on the left Range of motion is minimally  diminished Pupils are equal and reactive Cranial nerves are intact There are no new neurological normal musculoskeletal findings   Nursing note and vitals reviewed.         Assessment & Plan:   Assessment 1 chronic low back pain 2 lumbar degenerative disc disease 3 Lumbar radiculopathy    Plan of management 1 Will continue hydrocodone/acetaminophen 5/325 mg 1-2 tabs when necessary and will give her 45 tablets 2 we will follow-up with her in 1 month We'll plan any interventions that may be necessary at that time   Established patient    level III    Tod PersiaWinston Mecca Guitron M.D.

## 2015-11-21 ENCOUNTER — Encounter: Payer: Self-pay | Admitting: Anesthesiology

## 2015-11-21 ENCOUNTER — Ambulatory Visit: Payer: Medicaid Other | Attending: Anesthesiology | Admitting: Anesthesiology

## 2015-11-21 VITALS — BP 115/89 | HR 78 | Temp 98.1°F | Resp 18 | Ht 65.0 in | Wt 205.0 lb

## 2015-11-21 DIAGNOSIS — M25561 Pain in right knee: Secondary | ICD-10-CM | POA: Insufficient documentation

## 2015-11-21 DIAGNOSIS — M549 Dorsalgia, unspecified: Secondary | ICD-10-CM | POA: Diagnosis present

## 2015-11-21 DIAGNOSIS — M179 Osteoarthritis of knee, unspecified: Secondary | ICD-10-CM | POA: Insufficient documentation

## 2015-11-21 DIAGNOSIS — M25562 Pain in left knee: Secondary | ICD-10-CM

## 2015-11-21 DIAGNOSIS — M1711 Unilateral primary osteoarthritis, right knee: Secondary | ICD-10-CM | POA: Insufficient documentation

## 2015-11-21 DIAGNOSIS — M545 Low back pain: Secondary | ICD-10-CM | POA: Insufficient documentation

## 2015-11-21 DIAGNOSIS — G8929 Other chronic pain: Secondary | ICD-10-CM | POA: Diagnosis not present

## 2015-11-21 DIAGNOSIS — M1710 Unilateral primary osteoarthritis, unspecified knee: Secondary | ICD-10-CM | POA: Insufficient documentation

## 2015-11-21 MED ORDER — HYDROCODONE-ACETAMINOPHEN 5-325 MG PO TABS: 1.0000 | ORAL_TABLET | Freq: Two times a day (BID) | ORAL | Status: DC

## 2015-11-21 NOTE — Progress Notes (Signed)
Safety precautions to be maintained throughout the outpatient stay will include: orient to surroundings, keep bed in low position, maintain call bell within reach at all times, provide assistance with transfer out of bed and ambulation.  

## 2015-11-21 NOTE — Progress Notes (Signed)
   Subjective:    Patient ID: Kristie Santana, female    DOB: 04/21/1976, [age] y.o.   MRN: [id number]  HPI  This patient return to the clinic today  Indicating that her back pain was much  Improved She said that her major problem today was chronic right knee pain She indicated that her subjective  Pain intensity rating was 55%   Review of Systems  Constitutional: Negative.   HENT: Negative.   Eyes: Negative.   Respiratory: Negative.   Cardiovascular: Negative.   Gastrointestinal: Negative.   Endocrine: Negative.   Genitourinary: Negative.   Musculoskeletal: Negative.   Skin: Negative.   Allergic/Immunologic: Negative.   Neurological: Negative.   Hematological: Negative.   Psychiatric/Behavioral: Negative.        Objective:   Physical Exam  Cardiovascular:  This patient appeared to be in no significant distress Her blood pressure was 115/89 mmHg Her pulse was 78 bpm Equal and regular Heart sounds 1 and 2 were heard in all areas Temperature was 98.41F Respirations were 18 breaths per minute sPO2 was 100% Chest is clinically clear There were no adventitious sounds Abdomen is soft and nontender There was no palpable organomegaly There was no significant lymphadenopathy Her right knee was somewhat swollen and there was crepitus on flexion of the knee There was decreased range of motion  In flexion and extension of the left knee Pupils were equal and reactive Cranial nerves were intact Were no other positive musculoskeletal findings  Nursing note and vitals reviewed.         Assessment & Plan:   Assessment 1  pain in the right knee 2 osteoarthritis of the right knee 3 status post chronic low back pain   Plan of management 1  we will perform a right femoral no bloc her in the next 3 days 2 we will give her Vicodin 5/325 mg twice a day when necessary and I will give her 45 tablets   Established patient    Level III   Tod PersiaWinston Kamarri Fischetti M.D.

## 2015-11-21 NOTE — Patient Instructions (Signed)
You must bring someone who can drive you home. Do not eat or drink 2 hours before your procedure. You were given a prescriptions for Hydrocodone today.

## 2015-11-24 ENCOUNTER — Ambulatory Visit: Payer: Medicaid Other | Attending: Anesthesiology | Admitting: Anesthesiology

## 2015-11-24 ENCOUNTER — Encounter: Payer: Self-pay | Admitting: Anesthesiology

## 2015-11-24 VITALS — BP 116/82 | HR 67 | Temp 98.0°F | Resp 16 | Ht 65.0 in | Wt 205.0 lb

## 2015-11-24 DIAGNOSIS — G8929 Other chronic pain: Secondary | ICD-10-CM | POA: Diagnosis present

## 2015-11-24 DIAGNOSIS — M25562 Pain in left knee: Secondary | ICD-10-CM

## 2015-11-24 DIAGNOSIS — M25561 Pain in right knee: Secondary | ICD-10-CM | POA: Diagnosis not present

## 2015-11-24 DIAGNOSIS — M1711 Unilateral primary osteoarthritis, right knee: Secondary | ICD-10-CM | POA: Insufficient documentation

## 2015-11-24 MED ORDER — BUPIVACAINE HCL (PF) 0.5 % IJ SOLN: 10.0000 mL | Freq: Once | INTRAMUSCULAR | Status: AC

## 2015-11-24 MED ORDER — BUPIVACAINE HCL (PF) 0.5 % IJ SOLN
INTRAMUSCULAR | Status: AC
  Administered 2015-11-24: 10:00:00
  Filled 2015-11-24: qty 30

## 2015-11-24 MED ORDER — TRIAMCINOLONE ACETONIDE 40 MG/ML IJ SUSP
INTRAMUSCULAR | Status: AC
  Administered 2015-11-24: 10:00:00
  Filled 2015-11-24: qty 1

## 2015-11-24 MED ORDER — TRIAMCINOLONE ACETONIDE 40 MG/ML IJ SUSP: 80.0000 mg | Freq: Once | INTRAMUSCULAR | Status: AC

## 2015-11-24 NOTE — Progress Notes (Signed)
   Subjective:    Patient ID: Kristie Santana, female    DOB: 06/05/1976, 40 y.o.   MRN: [id number]  HPI    Review of Systems     Objective:   Physical Exam        Assessment & Plan:

## 2015-11-24 NOTE — Procedures (Signed)
Date of procedure : 11/24/2015  Preoperative Diagnosis:  1 chronic right knee pain 2 osteoarthritis of the right knee  Postoperative Diagnosis:  Same.  Procedure: 1. right Femoral Nerve Block  Surgeon: Tod PersiaWinston Eugene Zeiders, MD  Anesthesia: Very light intravenous sedation without opioids, administered by the nurse and staff under my direction  Informed consent was obtained and the patient appeared to accept and understand the benefits and risks of this procedure.   Pre procedure comments:  None  Description of the Procedure:  The patient was taken to the operating room and placed in the prone position.  The right groin and the rightmedial aspect of the upper thigh was prepped with Betadine.  right Femoral Nerve Block: After appropriate draping, the right femoral artery was palpated.  Then with the hand on the right femoral artery, a 1-inch, 22-gauge needle was inserted immediately lateral to the palpated right femoral artery and inserted perpendicularly into the groin just below the inguinal canal.  Paresthesia was elicited going down into the right thigh.  Aspirations were negative for blood and other body fluids.  Then 10 cc of 0.5% Bupivacaine and 40 mg of Kenalog were injected.  The needle was removed and adequate hemostasis was established.    Patient tolerated the procedures quite well.  Vital signs were stable.  There were no adverse effects.   Patient felt a little apprehensive as her left leg began to get numb but this was due to a mild panic attack  After 510 minutes she felt quite normal in all her vital signs were stable  The patient was taken to the recover room in satisfactory condition where patient was observed and subsequently discharged.  We will follow up with the patient in 2 months   Tod PersiaWinston Xylon Croom M.D.

## 2015-11-24 NOTE — Progress Notes (Signed)
Safety precautions to be maintained throughout the outpatient stay will include: orient to surroundings, keep bed in low position, maintain call bell within reach at all times, provide assistance with transfer out of bed and ambulation.  

## 2015-11-24 NOTE — Patient Instructions (Signed)
Pain Management Discharge Instructions  General Discharge Instructions :  If you need to reach your doctor call: Monday-Friday 8:00 am - 4:00 pm at [phone] or toll free [phone].  After clinic hours [phone] to have operator reach doctor.  Bring all of your medication bottles to all your appointments in the pain clinic.  To cancel or reschedule your appointment with Pain Management please remember to call 24 hours in advance to avoid a fee.  Refer to the educational materials which you have been given on: General Risks, I had my Procedure. Discharge Instructions, Post Sedation.  Post Procedure Instructions:  The drugs you were given will stay in your system until tomorrow, so for the next 24 hours you should not drive, make any legal decisions or drink any alcoholic beverages.  You may eat anything you prefer, but it is better to start with liquids then soups and crackers, and gradually work up to solid foods.  Please notify your doctor immediately if you have any unusual bleeding, trouble breathing or pain that is not related to your normal pain.  Depending on the type of procedure that was done, some parts of your body may feel week and/or numb.  This usually clears up by tonight or the next day.  Walk with the use of an assistive device or accompanied by an adult for the 24 hours.  You may use ice on the affected area for the first 24 hours.  Put ice in a Ziploc bag and cover with a towel and place against area 15 minutes on 15 minutes off.  You may switch to heat after 24 hours.Lateral Femoral Cutaneous Nerve Block Patient Information  Description: The lateral femoral cutaneous nerve of the thigh is a purely sensory nerve that can become entrapped or irritated for a number of reasons.  The pain associated with this condition is called meralgia paraesthetica.  Patients affected with this syndrome have burning pain or abnormal sensation along the lateral aspect of the  thigh.  The pain can be worsened by prolonged walking, standing, or constrictive garments around the house.   The diagnosis can be confirmed and treatment initiated by blocking the nerve with local anesthetic (like Novocaine).  At times, a steroid solution may be injected at the same time.  The site of injection is through a tiny needle in the left, lower quadrant of the abdomen.   The entire block usually lasts less than 5 minutes.  Conditions which may be treated by lateral femoral cutaneous nerve block:   Meralagia paraesthetica  Preparation for the injection:  1. Do not eat any solid food or dairy products within 8 hours of your appointment.  2. You may drink clear liquids up to 3 hours before appointment.  Clear liquids include water, black coffee, juice or soda. No milk or cream please. 3. You may take your regular medication, including pain medications, with a sip of water before your appointment.  Diabetics should hold regular insulin (if taken separately) and take 1/2 normal NPH dose the morning of the procedure.  Carry some sugar containing items with you to your appointment. 4. A driver must accompany you and be prepared to drive you home after your procedure. 5. Bring all you current medications with you 6. An IV may be inserted and sedation may be given at the discretion of the physician. 7. A blood pressure cuff, EKG and other monitors will often be applied during the procedure.  Some patients may need to have extra oxygen  administered for a short period. 8. You will be asked to provide medical information, including your allergies and medications, prior to the procedure.  We must know immediately if you are taking blood thinners (like Coumadin/Warfarin) or if you allergic to IV iodine contrast (dye)  We must know if you could possible be pregnant.   Possible side-effects:   Bleeding from needle site  Infection (rate, may require surgery)  Nerve injury (rare)  Numbness and  Tingling (temporary)  Light-headedness (temporary)  Pain at injection site (several day)  Decreased blood pressure (rare, temporary)  Weakness in leg (temporary)  Call if you experience:  Hives or difficulty breathing (go to the emergency room)  Inflammation or drainage at the injection site(s)  Please note:  Although the local anesthetic injected can often make your leg feel good for several hours after the injection,  The pain may return.  It takes 3-7 days for steroids to work.  You may not notice any pain relief for at least one week.  If effective, we will often do a series of injections spaced 3-6 weeks apart to maximally decrease your pain.  If you have any questions, please cll [phone] [facility]   

## 2015-11-24 NOTE — Progress Notes (Signed)
1005 pt very anxious thrieving in bed- states she couldn't breathe- o2 applied sat pt up in bed- Dr Starling MannsParris can to assess pt---"I think I was having a panic attack" 1010 eating and drinking- Pt now relaxed  "I think my leg being numb scared me"- talked about need for asst when getting up today r/t numbness

## 2015-11-27 ENCOUNTER — Telehealth: Payer: Self-pay | Admitting: *Deleted

## 2015-11-27 NOTE — Telephone Encounter (Signed)
No problems post procedure. 

## 2015-12-05 ENCOUNTER — Ambulatory Visit: Payer: Medicaid Other | Admitting: Family Medicine

## 2015-12-15 ENCOUNTER — Other Ambulatory Visit: Payer: Self-pay | Admitting: Family Medicine

## 2015-12-19 ENCOUNTER — Ambulatory Visit (INDEPENDENT_AMBULATORY_CARE_PROVIDER_SITE_OTHER): Payer: Medicaid Other | Admitting: Family Medicine

## 2015-12-19 ENCOUNTER — Encounter: Payer: Self-pay | Admitting: Family Medicine

## 2015-12-19 VITALS — BP 110/68 | HR 112 | Temp 98.3°F | Resp 18 | Ht 65.0 in | Wt 210.2 lb

## 2015-12-19 DIAGNOSIS — M797 Fibromyalgia: Secondary | ICD-10-CM

## 2015-12-19 DIAGNOSIS — J42 Unspecified chronic bronchitis: Secondary | ICD-10-CM

## 2015-12-19 DIAGNOSIS — I10 Essential (primary) hypertension: Secondary | ICD-10-CM | POA: Diagnosis not present

## 2015-12-19 DIAGNOSIS — E785 Hyperlipidemia, unspecified: Secondary | ICD-10-CM | POA: Diagnosis not present

## 2015-12-19 DIAGNOSIS — E1140 Type 2 diabetes mellitus with diabetic neuropathy, unspecified: Secondary | ICD-10-CM

## 2015-12-19 DIAGNOSIS — E119 Type 2 diabetes mellitus without complications: Secondary | ICD-10-CM | POA: Diagnosis not present

## 2015-12-19 DIAGNOSIS — G8929 Other chronic pain: Secondary | ICD-10-CM | POA: Diagnosis not present

## 2015-12-19 DIAGNOSIS — K219 Gastro-esophageal reflux disease without esophagitis: Secondary | ICD-10-CM

## 2015-12-19 LAB — POCT UA - MICROALBUMIN: Microalbumin Ur, POC: 20 mg/L

## 2015-12-19 LAB — POCT GLYCOSYLATED HEMOGLOBIN (HGB A1C): Hemoglobin A1C: 6.9

## 2015-12-19 MED ORDER — DULOXETINE HCL 30 MG PO CPEP: 30.0000 mg | ORAL_CAPSULE | Freq: Every day | ORAL | Status: DC

## 2015-12-19 MED ORDER — CHLORTHALIDONE 25 MG PO TABS: 25.0000 mg | ORAL_TABLET | Freq: Every day | ORAL | Status: DC

## 2015-12-19 MED ORDER — METFORMIN HCL 850 MG PO TABS: 850.0000 mg | ORAL_TABLET | Freq: Every day | ORAL | Status: AC

## 2015-12-19 MED ORDER — OMEPRAZOLE 20 MG PO CPDR: 20.0000 mg | DELAYED_RELEASE_CAPSULE | Freq: Two times a day (BID) | ORAL | Status: AC

## 2015-12-19 MED ORDER — ALBUTEROL SULFATE HFA 108 (90 BASE) MCG/ACT IN AERS: 2.0000 | INHALATION_SPRAY | Freq: Four times a day (QID) | RESPIRATORY_TRACT | Status: DC | PRN

## 2015-12-19 MED ORDER — ASPIRIN EC 81 MG PO TBEC: 81.0000 mg | DELAYED_RELEASE_TABLET | Freq: Every day | ORAL | Status: AC

## 2015-12-19 MED ORDER — DILTIAZEM HCL ER COATED BEADS 180 MG PO TB24: 180.0000 mg | ORAL_TABLET | Freq: Every day | ORAL | Status: DC

## 2015-12-19 MED ORDER — FLUTICASONE-SALMETEROL 100-50 MCG/DOSE IN AEPB: 1.0000 | INHALATION_SPRAY | Freq: Two times a day (BID) | RESPIRATORY_TRACT | Status: DC

## 2015-12-19 NOTE — Progress Notes (Signed)
Name: Kristie Santana   MRN: [id number]    DOB: 1975/09/26   Date:12/19/2015       Progress Note  Subjective  Chief Complaint  Chief Complaint  Patient presents with  . Medication Refill    3 month F/U  . Diabetes    Patient does not check sugar at home and denies any problems.  . Gastroesophageal Reflux    Well controlled taking daily  . Allergic Rhinitis     Symptoms are worsen the past 2 weeks-with itchy ear, nose, throat. Bloody mucus, sinus pain/pressure, ear pressure and pain.  Marland Kitchen COPD    Well controlled with inhalers    HPI  Chronic pain:she is now at the pain clinic and is getting Hydrocodone, she has low back pain and had epidural injections by the pain clinic. Pain level today is 5/10, described as aching and sharp at times, radiates down both legs. She also has both knee pains -that improved with nerve block. Taking Gabapentin  HTN: taking bp medication , she has noticed some cramping, she has also noticed dizziness/lightheaded. She has tachycardia now - but has been out of Cardizem for the past 2 days.   GERD: well controlled with medication, denies side effects of medication. No heartburn no regurgitation.   COPD: still smoking, she still has a productive cough usually in am's, no wheezing or SOB. Using Advair daily and it helps with her symptoms. 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 She is taking Metformin as prescribed, she is due for an eye exam, she refused Pneumonia vaccine. She has been drinking sodas again and hgbA1C has gone up from 6.5% to 6.9%, discussed importance of resuming diet. She has cramping on both hands and feet - resolves with GAbapentin  AR: controlled with medication  FMS: she aches all over, she has mental fogginess at times. She states pain gets worse when she is more active. She never started taking Cymbalta, but is willing to take it now. Discussed again possible side  effects  Patient Active Problem List   Diagnosis Date Noted  . Knee pain, bilateral 11/21/2015  . OA (osteoarthritis) of knee 11/21/2015  . Back pain at L4-L5 level 07/07/2015  . DDD (degenerative disc disease), lumbar 07/07/2015  . Lumbar radiculopathy 07/07/2015  . Streptococcal pharyngitis 03/20/2015  . Achilles tendinitis 02/22/2015  . CAFL (chronic airflow limitation) (HCC) 02/22/2015  . Benign hypertension 02/22/2015  . Chronic pain 02/22/2015  . Inflammation of sacroiliac joint (HCC) 02/22/2015  . Well controlled diabetes mellitus (HCC) 02/22/2015  . Dyslipidemia 02/22/2015  . Dysmetabolic syndrome 02/22/2015  . H/O abnormal cervical Papanicolaou smear 02/22/2015  . H/O renal calculi 02/22/2015  . History of methicillin resistant Staphylococcus aureus infection 02/22/2015  . Allergic rhinitis 02/22/2015  . Compulsive tobacco user syndrome 02/22/2015  . 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 Obesity (BMI 30-39.9) 02/22/2015  . Gastro-esophageal reflux disease without esophagitis 09/30/2007    Past Surgical History  Procedure Laterality Date  . Leep    . Cholecystectomy    . Dilation and curettage of uterus    . Tubal ligation      Family History  Problem Relation Age of Onset  . Hypertension Mother   . Hypertension Sister   . Diabetes Sister     Social History   Social History  . Marital Status: Single    Spouse Name: N/A  . Number of Children: N/A  . Years of Education:  N/A   Occupational History  . Not on file.   Social History Main Topics  . Smoking status: Current Some Day Smoker -- 1.00 packs/day for 24 years    Types: Cigarettes    Start date: 02/23/1991  . Smokeless tobacco: Never Used  . Alcohol Use: 0.0 oz/week    0 Standard drinks or equivalent per week     Comment: occasionally  . Drug Use: No  . Sexual Activity:    Partners: Male    Birth Control/ Protection: Other-see comments     Comment: Tubal Ligation   Other Topics Concern  . Not  on file   Social History Narrative     Current outpatient prescriptions:  .  albuterol (PROAIR HFA) 108 (90 Base) MCG/ACT inhaler, Inhale 2 puffs into the lungs every 6 (six) hours as needed for wheezing or shortness of breath., Disp: 8.5 Inhaler, Rfl: 0 .  chlorthalidone (HYGROTON) 25 MG tablet, Take 1 tablet (25 mg total) by mouth daily., Disp: 30 tablet, Rfl: 5 .  diltiazem (CARDIZEM LA) 180 MG 24 hr tablet, Take 1 tablet (180 mg total) by mouth daily., Disp: 30 tablet, Rfl: 5 .  Fluticasone-Salmeterol (ADVAIR) 100-50 MCG/DOSE AEPB, Inhale 1 puff into the lungs 2 (two) times daily., Disp: 1 each, Rfl: 2 .  gabapentin (NEURONTIN) 100 MG capsule, Take 1 capsule (100 mg total) by mouth 3 (three) times daily., Disp: 90 capsule, Rfl: 1 .  HYDROcodone-acetaminophen (HYCET) 7.5-325 mg/15 ml solution, take 15 milliliters by mouth twice a day BETWEEN MEALS AS NEEDED FOR MODERATE PAIN, Disp: , Rfl: 0 .  loratadine (CLARITIN) 10 MG tablet, Take 1 tablet (10 mg total) by mouth daily., Disp: 30 tablet, Rfl: 5 .  metFORMIN (GLUCOPHAGE) 850 MG tablet, Take 1 tablet (850 mg total) by mouth daily., Disp: 30 tablet, Rfl: 5 .  omeprazole (PRILOSEC) 20 MG capsule, Take 1 capsule (20 mg total) by mouth 2 (two) times daily., Disp: 60 capsule, Rfl: 5 .  aspirin EC 81 MG tablet, Take 1 tablet (81 mg total) by mouth daily., Disp: 30 tablet, Rfl: 0  Current facility-administered medications:  .  bupivacaine (MARCAINE) 0.5 % injection 10 mL, 10 mL, Infiltration, Once, Tod PersiaWinston Parris, MD .  midazolam (VERSED) 5 MG/5ML injection 1 mg, 1 mg, Intravenous, UD, Tod PersiaWinston Parris, MD, 2 mg at 08/30/15 0925 .  triamcinolone acetonide (KENALOG-40) injection 80 mg, 80 mg, Intramuscular, Once, Tod PersiaWinston Parris, MD  Allergies  Allergen Reactions  . Ace Inhibitors Cough  . Fish Allergy Itching and Swelling  . Latex   . Penicillins   . Sulfa Antibiotics Hives     ROS  Constitutional: Negative for fever , positive for   weight change.  Respiratory: Potassium for cough and shortness of breath.   Cardiovascular: Negative for chest pain or palpitations.  Gastrointestinal: Negative for abdominal pain, no bowel changes.  Musculoskeletal: Positive for gait problem - secondary to back pain no  joint swelling.  Skin: Negative for rash.  Neurological: Negative for dizziness or headache.  No other specific complaints in a complete review of systems (except as listed in HPI above).  Objective  Filed Vitals:   12/19/15 0921  BP: 110/68  Pulse: 112  Temp: 98.3 F (36.8 C)  TempSrc: Oral  Resp: 18  Height: 5\' 5"  (1.651 m)  Weight: 210 lb 3.2 oz (95.346 kg)  SpO2: 97%    Body mass index is 34.98 kg/(m^2).  Physical Exam  Constitutional: Patient appears well-developed and well-nourished. Obese  No distress.  HEENT: head atraumatic, normocephalic, pupils equal and reactive to light,  neck supple, throat within normal limits Cardiovascular: Normal rate, regular rhythm and normal heart sounds.  No murmur heard. No BLE edema. Pulmonary/Chest: Effort normal and breath sounds normal. No respiratory distress. Abdominal: Soft.  There is no tenderness. Psychiatric: Patient has a normal mood and affect. behavior is normal. Judgment and thought content normal.  Recent Results (from the past 2160 hour(s))  ToxASSURE Select 13 (MW), Urine     Status: None   Collection Time: 09/26/15 12:00 AM  Result Value Ref Range   Report Summary FINAL     Comment: ==================================================================== TOXASSURE SELECT 13 (MW) ==================================================================== Test                             Result       Flag       Units Drug Absent but Declared for Prescription Verification   Hydrocodone                    Not Detected UNEXPECTED ng/mg creat ==================================================================== Test                      Result    Flag   Units       Ref Range   Creatinine              156              mg/dL      >=29 ==================================================================== Declared Medications:  The flagging and interpretation on this report are based on the  following declared medications.  Unexpected results may arise from  inaccuracies in the declared medications.  **Note: The testing scope of this panel includes these medications:  Hydrocodone  **Note: The testing scope of this panel does not include following  reported medications:   Duloxetine (Cymbalta)  Meloxicam (Mobic) ==================================================================== For clinical consultation, please call [phone]. ====================================================================    PDF .   POCT HgB A1C     Status: None   Collection Time: 12/19/15  9:25 AM  Result Value Ref Range   Hemoglobin A1C 6.9   POCT UA - Microalbumin     Status: None   Collection Time: 12/19/15  9:26 AM  Result Value Ref Range   Microalbumin Ur, POC 20 mg/L   Creatinine, POC  mg/dL   Albumin/Creatinine Ratio, Urine, POC       PHQ2/9: Depression screen [facility] 2/9 12/19/2015 11/24/2015 11/21/2015 10/23/2015 08/31/2015  Decreased Interest 0 0 0 0 0  Down, Depressed, Hopeless 0 0 0 0 0  PHQ - 2 Score 0 0 0 0 0  Altered sleeping - - - - -  Tired, decreased energy - - - - -  Change in appetite - - - - -  Feeling bad or failure about yourself  - - - - -  Trouble concentrating - - - - -  Moving slowly or fidgety/restless - - - - -  Suicidal thoughts - - - - -  PHQ-9 Score - - - - -  Difficult doing work/chores - - - - -     Fall Risk: Fall Risk  12/19/2015 11/24/2015 11/21/2015 10/23/2015 09/26/2015  Falls in the past year? Yes No No No No  Number falls in past yr: 2 or more - - - -  Injury with Fall? No - - - -  Follow up - - - - -  Functional Status Survey: Is the patient deaf or have difficulty hearing?: No Does the patient have difficulty seeing,  even when wearing glasses/contacts?: No Does the patient have difficulty concentrating, remembering, or making decisions?: No Does the patient have difficulty walking or climbing stairs?: No Does the patient have difficulty dressing or bathing?: No Does the patient have difficulty doing errands alone such as visiting a doctor's office or shopping?: No    Assessment & Plan  1. Well controlled diabetes mellitus (HCC)  Resume diet - stop drinking sodas. Continue medication  - POCT HgB A1C - POCT UA - Microalbumin - Comprehensive metabolic panel  - metFORMIN (GLUCOPHAGE) 850 MG tablet; Take 1 tablet (850 mg total) by mouth daily.  Dispense: 30 tablet; Refill: 5  2. Benign hypertension  Needs to have labs done - chlorthalidone (HYGROTON) 25 MG tablet; Take 1 tablet (25 mg total) by mouth daily.  Dispense: 30 tablet; Refill: 5 - diltiazem (CARDIZEM LA) 180 MG 24 hr tablet; Take 1 tablet (180 mg total) by mouth daily.  Dispense: 30 tablet; Refill: 5 - Comprehensive metabolic panel  3. Chronic pain  Continue follow up with pain clinic  4. Fibromyalgia  Needs to start Cymbalta, continue gabapentin  5. Dyslipidemia  - Lipid panel  6. Chronic bronchitis, unspecified chronic bronchitis type (HCC)  - albuterol (PROAIR HFA) 108 (90 Base) MCG/ACT inhaler; Inhale 2 puffs into the lungs every 6 (six) hours as needed for wheezing or shortness of breath.  Dispense: 8.5 Inhaler; Refill: 0 - Fluticasone-Salmeterol (ADVAIR) 100-50 MCG/DOSE AEPB; Inhale 1 puff into the lungs 2 (two) times daily.  Dispense: 1 each; Refill: 2  8. Diabetes mellitus without complication (HCC)  - metFORMIN (GLUCOPHAGE) 850 MG tablet; Take 1 tablet (850 mg total) by mouth daily.  Dispense: 30 tablet; Refill: 5

## 2015-12-21 ENCOUNTER — Encounter: Payer: Self-pay | Admitting: Family Medicine

## 2015-12-21 ENCOUNTER — Ambulatory Visit (INDEPENDENT_AMBULATORY_CARE_PROVIDER_SITE_OTHER): Payer: Medicaid Other | Admitting: Family Medicine

## 2015-12-21 VITALS — BP 112/78 | HR 84 | Temp 98.5°F | Resp 16 | Wt 210.0 lb

## 2015-12-21 DIAGNOSIS — Z124 Encounter for screening for malignant neoplasm of cervix: Secondary | ICD-10-CM | POA: Diagnosis not present

## 2015-12-21 DIAGNOSIS — Z01419 Encounter for gynecological examination (general) (routine) without abnormal findings: Secondary | ICD-10-CM

## 2015-12-21 DIAGNOSIS — Z0000 Encounter for general adult medical examination without abnormal findings: Secondary | ICD-10-CM

## 2015-12-21 DIAGNOSIS — Z1239 Encounter for other screening for malignant neoplasm of breast: Secondary | ICD-10-CM | POA: Diagnosis not present

## 2015-12-21 DIAGNOSIS — T192XXA Foreign body in vulva and vagina, initial encounter: Secondary | ICD-10-CM | POA: Diagnosis not present

## 2015-12-21 NOTE — Progress Notes (Signed)
Name: Kristie Santana   MRN: [id number]    DOB: 06-10-1976   Date:12/21/2015       Progress Note  Subjective  Chief Complaint  Chief Complaint  Patient presents with  . Annual Exam    HPI  Well Woman: she was dating the same person for 6 years, but they broke up about 4 months ago. She is dating somebody else now. She denies vaginal discharge, pain or bladder symptoms.   Patient Active Problem List   Diagnosis Date Noted  . Knee pain, bilateral 11/21/2015  . OA (osteoarthritis) of knee 11/21/2015  . Back pain at L4-L5 level 07/07/2015  . DDD (degenerative disc disease), lumbar 07/07/2015  . Lumbar radiculopathy 07/07/2015  . Streptococcal pharyngitis 03/20/2015  . Achilles tendinitis 02/22/2015  . CAFL (chronic airflow limitation) (HCC) 02/22/2015  . Benign hypertension 02/22/2015  . Chronic pain 02/22/2015  . Inflammation of sacroiliac joint (HCC) 02/22/2015  . 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 Dyslipidemia 02/22/2015  . Dysmetabolic syndrome 02/22/2015  . H/O abnormal cervical Papanicolaou smear 02/22/2015  . H/O renal calculi 02/22/2015  . History of methicillin resistant Staphylococcus aureus infection 02/22/2015  . Allergic rhinitis 02/22/2015  . Compulsive tobacco user syndrome 02/22/2015  . 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 Obesity (BMI 30-39.9) 02/22/2015  . Gastro-esophageal reflux disease without esophagitis 09/30/2007    Past Surgical History  Procedure Laterality Date  . Leep    . Cholecystectomy    . Dilation and curettage of uterus    . Tubal ligation      Family History  Problem Relation Age of Onset  . Hypertension Mother   . Hypertension Sister   . Diabetes Sister     Social History   Social History  . Marital Status: Single    Spouse Name: N/A  . Number of Children: N/A  . Years of Education: N/A   Occupational History  . Not on file.   Social History Main Topics  . Smoking status:  Current Some Day Smoker -- 1.00 packs/day for 24 years    Types: Cigarettes    Start date: 02/23/1991  . Smokeless tobacco: Never Used  . Alcohol Use: 0.0 oz/week    0 Standard drinks or equivalent per week     Comment: occasionally  . Drug Use: No  . Sexual Activity:    Partners: Male    Birth Control/ Protection: Other-see comments     Comment: Tubal Ligation   Other Topics Concern  . Not on file   Social History Narrative     Current outpatient prescriptions:  .  albuterol (PROAIR HFA) 108 (90 Base) MCG/ACT inhaler, Inhale 2 puffs into the lungs every 6 (six) hours as needed for wheezing or shortness of breath., Disp: 8.5 Inhaler, Rfl: 0 .  aspirin EC 81 MG tablet, Take 1 tablet (81 mg total) by mouth daily., Disp: 30 tablet, Rfl: 0 .  chlorthalidone (HYGROTON) 25 MG tablet, Take 1 tablet (25 mg total) by mouth daily., Disp: 30 tablet, Rfl: 5 .  diltiazem (CARDIZEM LA) 180 MG 24 hr tablet, Take 1 tablet (180 mg total) by mouth daily., Disp: 30 tablet, Rfl: 5 .  DULoxetine (CYMBALTA) 30 MG capsule, Take 1 capsule (30 mg total) by mouth daily. For the first week after that 2 daily, Disp: 60 capsule, Rfl: 0 .  Fluticasone-Salmeterol (ADVAIR) 100-50 MCG/DOSE AEPB, Inhale 1 puff into the lungs 2 (two) times daily., Disp: 1 each, Rfl: 2 .  gabapentin (NEURONTIN) 100 MG capsule, Take 1 capsule (100 mg total) by mouth 3 (three) times daily., Disp: 90 capsule, Rfl: 1 .  HYDROcodone-acetaminophen (HYCET) 7.5-325 mg/15 ml solution, take 15 milliliters by mouth twice a day BETWEEN MEALS AS NEEDED FOR MODERATE PAIN, Disp: , Rfl: 0 .  loratadine (CLARITIN) 10 MG tablet, Take 1 tablet (10 mg total) by mouth daily., Disp: 30 tablet, Rfl: 5 .  metFORMIN (GLUCOPHAGE) 850 MG tablet, Take 1 tablet (850 mg total) by mouth daily., Disp: 30 tablet, Rfl: 5 .  omeprazole (PRILOSEC) 20 MG capsule, Take 1 capsule (20 mg total) by mouth 2 (two) times daily., Disp: 60 capsule, Rfl: 5  Current  facility-administered medications:  .  bupivacaine (MARCAINE) 0.5 % injection 10 mL, 10 mL, Infiltration, Once, Tod Persia, MD .  midazolam (VERSED) 5 MG/5ML injection 1 mg, 1 mg, Intravenous, UD, Tod Persia, MD, 2 mg at 08/30/15 0925 .  triamcinolone acetonide (KENALOG-40) injection 80 mg, 80 mg, Intramuscular, Once, Tod Persia, MD  Allergies  Allergen Reactions  . Ace Inhibitors Cough  . Fish Allergy Itching and Swelling  . Latex   . Penicillins   . Sulfa Antibiotics Hives     ROS  Constitutional: Negative for fever , positive for weight change.  Respiratory: Negative for cough and shortness of breath.   Cardiovascular: Negative for chest pain or palpitations.  Gastrointestinal: Negative for abdominal pain, no bowel changes.  Musculoskeletal: Negative for gait problem or joint swelling. She has chronic back pain, goes to pain clinic Skin: Negative for rash.  Neurological: Negative for dizziness or headache.  No other specific complaints in a complete review of systems (except as listed in HPI above).  Objective  Filed Vitals:   12/21/15 0821  BP: 112/78  Pulse: 84  Temp: 98.5 F (36.9 C)  TempSrc: Oral  Resp: 16  Weight: 210 lb (95.255 kg)  SpO2: 98%   LMP : 11/26/2015  Body mass index is 34.95 kg/(m^2).  Physical Exam  Constitutional: Patient appears well-developed and obese No distress.  HENT: Head: Normocephalic and atraumatic. Ears: B TMs ok, no erythema or effusion; Nose: Nose normal. Mouth/Throat: Oropharynx is clear and moist. No oropharyngeal exudate.  Eyes: Conjunctivae and EOM are normal. Pupils are equal, round, and reactive to light. No scleral icterus.  Neck: Normal range of motion. Neck supple. No JVD present. No thyromegaly present.  Cardiovascular: Normal rate, regular rhythm and normal heart sounds.  No murmur heard. No BLE edema. Pulmonary/Chest: Effort normal and breath sounds normal. No respiratory distress. Abdominal: Soft. Bowel  sounds are normal, no distension. There is no tenderness. no masses Breast: no lumps or masses, no nipple discharge or rashes FEMALE GENITALIA:  External genitalia normal External urethra normal Vaginal vault normal with brown discharge, no odor, a condom was found during exam. Patient gave verbal consent . Ring forceps was used and condoms removed.  Cervix normal without discharge or lesions Bimanual exam normal without masses RECTAL: not done Musculoskeletal: Normal range of motion, no joint effusions. No gross deformities Neurological: he is alert and oriented to person, place, and time. No cranial nerve deficit. Coordination, balance, strength, speech and gait are normal.  Skin: Skin is warm and dry. No rash noted. No erythema.  Psychiatric: Patient has a normal mood and affect. behavior is normal. Judgment and thought content normal.  Recent Results (from the past 2160 hour(s))  ToxASSURE Select 13 (MW), Urine     Status: None   Collection Time: 09/26/15 12:00  AM  Result Value Ref Range   Report Summary FINAL     Comment: ==================================================================== TOXASSURE SELECT 13 (MW) ==================================================================== Test                             Result       Flag       Units Drug Absent but Declared for Prescription Verification   Hydrocodone                    Not Detected UNEXPECTED ng/mg creat ==================================================================== Test                      Result    Flag   Units      Ref Range   Creatinine              156              mg/dL      >=16 ==================================================================== Declared Medications:  The flagging and interpretation on this report are based on the  following declared medications.  Unexpected results may arise from  inaccuracies in the declared medications.  **Note: The testing scope of this panel includes these  medications:  Hydrocodone  **Note: The testing scope of this panel does not include following  reported medications:   Duloxetine (Cymbalta)  Meloxicam (Mobic) ==================================================================== For clinical consultation, please call [phone]. ====================================================================    PDF .   POCT HgB A1C     Status: None   Collection Time: 12/19/15  9:25 AM  Result Value Ref Range   Hemoglobin A1C 6.9   POCT UA - Microalbumin     Status: None   Collection Time: 12/19/15  9:26 AM  Result Value Ref Range   Microalbumin Ur, POC 20 mg/L   Creatinine, POC  mg/dL   Albumin/Creatinine Ratio, Urine, POC       PHQ2/9: Depression screen [facility] 2/9 12/21/2015 12/19/2015 11/24/2015 11/21/2015 10/23/2015  Decreased Interest 0 0 0 0 0  Down, Depressed, Hopeless 0 0 0 0 0  PHQ - 2 Score 0 0 0 0 0  Altered sleeping - - - - -  Tired, decreased energy - - - - -  Change in appetite - - - - -  Feeling bad or failure about yourself  - - - - -  Trouble concentrating - - - - -  Moving slowly or fidgety/restless - - - - -  Suicidal thoughts - - - - -  PHQ-9 Score - - - - -  Difficult doing work/chores - - - - -     Fall Risk: Fall Risk  12/21/2015 12/19/2015 11/24/2015 11/21/2015 10/23/2015  Falls in the past year? Yes Yes No No No  Number falls in past yr: 2 or more 2 or more - - -  Injury with Fall? No No - - -  Follow up - - - - -      Functional Status Survey: Is the patient deaf or have difficulty hearing?: No Does the patient have difficulty seeing, even when wearing glasses/contacts?: No Does the patient have difficulty concentrating, remembering, or making decisions?: No Does the patient have difficulty walking or climbing stairs?: No Does the patient have difficulty dressing or bathing?: No Does the patient have difficulty doing errands alone such as visiting a doctor's office or shopping?: No    Assessment & Plan  1.  Well woman exam  Discussed  importance of 150 minutes of physical activity weekly, eat two servings of fish weekly- (she states she can eat tuna ) , eat one serving of tree nuts ( cashews, pistachios, pecans, almonds.Marland Kitchen.) every other day, eat 6 servings of fruit/vegetables daily and drink plenty of water and avoid sweet beverages.   2. Cervical cancer screening  - Pap IG, CT/NG NAA, and HPV (high risk)  3. Breast cancer screening  - MM Digital Screening; Future  4. Foreign body in vagina, initial encounter  Verbal consent given Condom was removed with ring forceps Patient tolerated procedure well No complications

## 2015-12-21 NOTE — Addendum Note (Signed)
Addended by: Alba CorySOWLES, Alaya Iverson F on: 12/21/2015 09:13 AM   Modules accepted: Kipp BroodSmartSet

## 2015-12-22 ENCOUNTER — Other Ambulatory Visit: Payer: Self-pay

## 2015-12-22 ENCOUNTER — Other Ambulatory Visit: Payer: Self-pay | Admitting: *Deleted

## 2015-12-22 LAB — COMPREHENSIVE METABOLIC PANEL
ALK PHOS: 72 IU/L (ref 39–117)
ALT: 16 IU/L (ref 0–32)
AST: 13 IU/L (ref 0–40)
Albumin/Globulin Ratio: 1.4 (ref 1.2–2.2)
Albumin: 4.1 g/dL (ref 3.5–5.5)
BUN/Creatinine Ratio: 14 (ref 9–23)
BUN: 12 mg/dL (ref 6–24)
Bilirubin Total: 0.3 mg/dL (ref 0.0–1.2)
CALCIUM: 9.6 mg/dL (ref 8.7–10.2)
CO2: 28 mmol/L (ref 18–29)
Chloride: 94 mmol/L — ABNORMAL LOW (ref 96–106)
Creatinine, Ser: 0.84 mg/dL (ref 0.57–1.00)
GFR calc Af Amer: 101 mL/min/{1.73_m2} (ref >59)
GFR calc non Af Amer: 87 mL/min/{1.73_m2} (ref >59)
GLUCOSE: 119 mg/dL — AB (ref 65–99)
Globulin, Total: 3 g/dL (ref 1.5–4.5)
Potassium: 3.8 mmol/L (ref 3.5–5.2)
SODIUM: 139 mmol/L (ref 134–144)
Total Protein: 7.1 g/dL (ref 6.0–8.5)

## 2015-12-22 LAB — LIPID PANEL
Chol/HDL Ratio: 3.1 ratio units (ref 0.0–4.4)
Cholesterol, Total: 183 mg/dL (ref 100–199)
HDL: 59 mg/dL (ref >39)
LDL CALC: 99 mg/dL (ref 0–99)
Triglycerides: 126 mg/dL (ref 0–149)
VLDL CHOLESTEROL CAL: 25 mg/dL (ref 5–40)

## 2015-12-22 MED ORDER — HYDROCODONE-ACETAMINOPHEN 5-325 MG PO TABS: 1.0000 | ORAL_TABLET | Freq: Two times a day (BID) | ORAL | Status: DC | PRN

## 2015-12-22 MED ORDER — GABAPENTIN 100 MG PO CAPS: 100.0000 mg | ORAL_CAPSULE | Freq: Three times a day (TID) | ORAL | Status: DC

## 2015-12-23 LAB — RPR: RPR: NONREACTIVE

## 2015-12-23 LAB — HIV ANTIBODY (ROUTINE TESTING W REFLEX): HIV SCREEN 4TH GENERATION: NONREACTIVE

## 2015-12-25 LAB — PAP IG, CT-NG NAA, HPV HIGH-RISK
Chlamydia, Nuc. Acid Amp: NEGATIVE
Gonococcus by Nucleic Acid Amp: NEGATIVE
HPV, HIGH-RISK: NEGATIVE
PAP Smear Comment: 0

## 2015-12-27 ENCOUNTER — Ambulatory Visit: Payer: Medicaid Other | Attending: Family Medicine

## 2016-01-19 ENCOUNTER — Encounter: Payer: Self-pay | Admitting: Anesthesiology

## 2016-01-19 ENCOUNTER — Ambulatory Visit: Payer: Medicaid Other | Attending: Anesthesiology | Admitting: Anesthesiology

## 2016-01-19 VITALS — BP 143/80 | HR 90 | Temp 98.6°F | Resp 16 | Ht 65.0 in | Wt 208.0 lb

## 2016-01-19 DIAGNOSIS — M545 Low back pain, unspecified: Secondary | ICD-10-CM

## 2016-01-19 DIAGNOSIS — G8929 Other chronic pain: Secondary | ICD-10-CM | POA: Insufficient documentation

## 2016-01-19 DIAGNOSIS — M5136 Other intervertebral disc degeneration, lumbar region: Secondary | ICD-10-CM

## 2016-01-19 DIAGNOSIS — M5416 Radiculopathy, lumbar region: Secondary | ICD-10-CM

## 2016-01-19 MED ORDER — HYDROCODONE-ACETAMINOPHEN 5-325 MG PO TABS: 1.0000 | ORAL_TABLET | Freq: Two times a day (BID) | ORAL | Status: DC | PRN

## 2016-01-19 NOTE — Progress Notes (Signed)
Patient is here today for medication management.  Her #1 c/o is knee pain.  Patient has experienced 2 falls since being seen her last when she was walking outside and her right leg "gave out" on her.    Patient needs refill on norco/vicodin  Safety precautions to be maintained throughout the outpatient stay will include: orient to surroundings, keep bed in low position, maintain call bell within reach at all times, provide assistance with transfer out of bed and ambulation.

## 2016-01-22 NOTE — Progress Notes (Signed)
   Subjective:    Patient ID: Kristie Santana, female    DOB: 05/01/1976, 40 y.o.   MRN: [id number]  HPI  This patient indicates that she is continuingto have pain S he appears to be drug-seeking She denies this observation and her denial makes my observation even more valid She appears to be in no distress She continues to complain of chronic low back pain  Review of Systems  Constitutional: Negative.   HENT: Negative.   Eyes: Negative.   Respiratory: Negative.   Cardiovascular: Negative.   Endocrine: Negative.   Genitourinary: Negative.   Musculoskeletal: Negative.   Skin: Negative.   Allergic/Immunologic: Negative.   Neurological: Negative.   Hematological: Negative.   Psychiatric/Behavioral: Negative.        Objective:   Physical Exam  Cardiovascular:  Physical examination is unremarkable Her vital signs are stable She has no new musculoskeletal or neurological findings Her vital signs are stable  Nursing note and vitals reviewed.         Assessment & Plan:   Assessment 1 I spent about 30 minutes discussing the need to begin to wean her off opioids and 2 take positive steps to avoid have becoming addicted to opioids She appeared responsive somewhat We'll continue her on Roxicet I/325 mg twice a day when necessary # 30 tablets We'll continue to plan to wean her off opioids in the future   Established patient         Level III     Tod PersiaWinston Bessy Reaney M.D.

## 2016-01-29 ENCOUNTER — Ambulatory Visit: Payer: Medicaid Other | Admitting: Family Medicine

## 2016-02-09 ENCOUNTER — Ambulatory Visit: Payer: Medicaid Other | Attending: Anesthesiology | Admitting: Anesthesiology

## 2016-02-09 ENCOUNTER — Encounter: Payer: Self-pay | Admitting: Anesthesiology

## 2016-02-09 VITALS — BP 124/75 | HR 67 | Temp 97.9°F | Resp 18 | Ht 65.0 in | Wt 208.0 lb

## 2016-02-09 DIAGNOSIS — M549 Dorsalgia, unspecified: Secondary | ICD-10-CM | POA: Diagnosis present

## 2016-02-09 DIAGNOSIS — M25561 Pain in right knee: Secondary | ICD-10-CM | POA: Insufficient documentation

## 2016-02-09 DIAGNOSIS — G8929 Other chronic pain: Secondary | ICD-10-CM | POA: Diagnosis not present

## 2016-02-09 DIAGNOSIS — M545 Low back pain, unspecified: Secondary | ICD-10-CM

## 2016-02-09 DIAGNOSIS — M5136 Other intervertebral disc degeneration, lumbar region: Secondary | ICD-10-CM

## 2016-02-09 DIAGNOSIS — M170 Bilateral primary osteoarthritis of knee: Secondary | ICD-10-CM | POA: Insufficient documentation

## 2016-02-09 DIAGNOSIS — M25562 Pain in left knee: Secondary | ICD-10-CM | POA: Diagnosis present

## 2016-02-09 MED ORDER — HYDROCODONE-ACETAMINOPHEN 5-325 MG PO TABS: 1.0000 | ORAL_TABLET | ORAL | Status: DC

## 2016-02-09 NOTE — Progress Notes (Signed)
   Subjective:    Patient ID: Kristie Santana, female    DOB: 10/21/1975, 40 y.o.   MRN: [id number]  HPI  This patient returned to the clinic today indicating that she is continuing to have pain She indicates that she has continued back pain and bilateral knee pain I tried to reason to her that her perception of pain is the result of her continued dependence on opioids that she seemed reluctant to accept that premise She is intending to see her primary care physician to perform more investigations on her knees so as to determine an etiology She indicates that she has most of her pain at night I will therefore give her Roxicet 5/325 mg to be taken at night She indicates that she's tried all the other known analgesic adjuvants and that she is allergic to most of them She is to see her primary care physician for follow-up  Review of Systems  Constitutional: Negative.   HENT: Negative.   Eyes: Negative.   Respiratory: Negative.   Cardiovascular: Negative.   Gastrointestinal: Negative.   Endocrine: Negative.   Genitourinary: Negative.   Musculoskeletal: Negative.   Skin: Negative.   Allergic/Immunologic: Negative.   Neurological: Negative.   Hematological: Negative.   Psychiatric/Behavioral: Negative.        Objective:   Physical Exam  Cardiovascular:  This patient appears to be in no distress Her subjective pain intensity rating is 70% Her parents does not support that level of pain Her blood pressure is 124/75 mmHg Temperature is 97.38F Respirations are 18 breaths per minute Pulse is 67 bpm S PO2 was 100% There are no new neurological or musculoskeletal findings  Nursing note and vitals reviewed.         Assessment & Plan:   Assessment 1 chronic low back pain 2 lumbar degenerative disc disease 3 bilateral knee pain 4 osteal arthritis of both knees    Plan of management 1 Will continue the patient on Roxicet 5/325 mg 1 tablet to be taken at night # 30  tablets 2 to return to the clinic for follow-up in 1 month   Established patient     level III    Tod PersiaWinston Jaspal Pultz M.D.

## 2016-02-09 NOTE — Progress Notes (Signed)
Safety precautions to be maintained throughout the outpatient stay will include: orient to surroundings, keep bed in low position, maintain call bell within reach at all times, provide assistance with transfer out of bed and ambulation.  

## 2016-02-09 NOTE — Patient Instructions (Signed)
You were given a prescription for Hydrocodone today 

## 2016-02-21 ENCOUNTER — Telehealth: Payer: Self-pay | Admitting: Family Medicine

## 2016-02-21 DIAGNOSIS — G8929 Other chronic pain: Secondary | ICD-10-CM

## 2016-02-21 NOTE — Telephone Encounter (Signed)
Patient called stating her appointment with the pain clinic with Dr. Ann MakiParrish has been canceled due to Dr. Ann MakiParrish no longer seeing patients.  Patient was due to get a refill on her pain medications on 03/01/16 and wants to know what is she going to do now.  Please advise.

## 2016-02-23 NOTE — Telephone Encounter (Signed)
Sorry. She will need to see another pain clinic. I am not going to prescribe it.

## 2016-03-01 ENCOUNTER — Encounter: Payer: Medicaid Other | Admitting: Anesthesiology

## 2016-03-12 ENCOUNTER — Ambulatory Visit: Payer: Medicaid Other | Admitting: Family Medicine

## 2016-03-12 NOTE — Telephone Encounter (Signed)
Patient notified and states she prefers to go to [facility], could you please put in a referral. Thanks

## 2016-03-12 NOTE — Telephone Encounter (Signed)
done

## 2016-03-12 NOTE — Addendum Note (Signed)
Addended by: Alba Cory F on: 03/12/2016 05:10 PM   Modules accepted: Orders

## 2016-03-26 ENCOUNTER — Ambulatory Visit: Payer: Medicaid Other | Admitting: Family Medicine

## 2016-06-21 ENCOUNTER — Other Ambulatory Visit: Payer: Self-pay | Admitting: Family Medicine

## 2016-06-21 DIAGNOSIS — J42 Unspecified chronic bronchitis: Secondary | ICD-10-CM

## 2016-06-21 NOTE — Telephone Encounter (Signed)
Patient requesting refill of Proair to [facility].  

## 2016-06-24 NOTE — Telephone Encounter (Signed)
Tried calling patient to inform her that she needs an appt. Phone # is not a recognizable #

## 2016-07-24 ENCOUNTER — Other Ambulatory Visit: Payer: Self-pay | Admitting: Family Medicine

## 2016-07-24 DIAGNOSIS — I10 Essential (primary) hypertension: Secondary | ICD-10-CM

## 2016-07-24 NOTE — Telephone Encounter (Signed)
Patient requesting refill of Diltiazem to [facility].  

## 2016-07-25 NOTE — Telephone Encounter (Signed)
Patient states she will give us a return call to schedule appointment due to her having a lot of appointments coming up

## 2016-08-20 ENCOUNTER — Encounter: Payer: Self-pay | Admitting: Family Medicine

## 2016-08-20 LAB — HM DIABETES EYE EXAM

## 2016-08-27 ENCOUNTER — Other Ambulatory Visit: Payer: Self-pay | Admitting: Family Medicine

## 2016-08-27 DIAGNOSIS — I10 Essential (primary) hypertension: Secondary | ICD-10-CM

## 2016-08-27 NOTE — Telephone Encounter (Signed)
Patient requesting refill of diltiazem to [facility].

## 2016-08-27 NOTE — Telephone Encounter (Signed)
Not seen since July needs follow up

## 2016-08-30 NOTE — Telephone Encounter (Signed)
Spoke with patient, she states that she will call back on Monday to schedule the appt. States that she rides the [employer] and she will have to give them a 3 day notice.

## 2016-09-03 ENCOUNTER — Other Ambulatory Visit: Payer: Self-pay

## 2016-09-03 ENCOUNTER — Other Ambulatory Visit: Payer: Self-pay | Admitting: Family Medicine

## 2016-09-03 ENCOUNTER — Encounter: Payer: Self-pay | Admitting: Family Medicine

## 2016-09-03 DIAGNOSIS — I10 Essential (primary) hypertension: Secondary | ICD-10-CM

## 2016-09-03 MED ORDER — DILTIAZEM HCL ER COATED BEADS 180 MG PO TB24: 180.0000 mg | ORAL_TABLET | Freq: Every day | ORAL | 0 refills | Status: DC

## 2016-09-03 NOTE — Telephone Encounter (Signed)
Patient called stating she is running out of her medications, explained to patient the last time she has been here was in May 2017. Patient states I was wrong and she has been here since then, I informed her Dr. Carlynn PurlSowles may fill her medications. But patient would have to schedule an appointment so she knows how much to give her until she comes in. Patient states she still has refills and why does she continue to get medication if that was her last visit. I must have my dates wrong.

## 2016-09-03 NOTE — Telephone Encounter (Signed)
Patient has been sent a discharge letter on due to compliance.

## 2016-09-03 NOTE — Telephone Encounter (Signed)
She missed 3 appointments and was rude to our staff today. Should I give 30 days and send dismissal letter?

## 2016-09-03 NOTE — Telephone Encounter (Signed)
Pt scheduled appointment for 09-18-16 and is asking that you please give her enough to last until then.

## 2016-09-03 NOTE — Telephone Encounter (Signed)
30 day sent to pharmacy and dismissal letter for non-compliance and rudeness towards staff was sent today

## 2016-09-06 ENCOUNTER — Telehealth: Payer: Self-pay

## 2016-09-06 DIAGNOSIS — I10 Essential (primary) hypertension: Secondary | ICD-10-CM

## 2016-09-09 NOTE — Telephone Encounter (Signed)
Erroneous entry

## 2016-09-18 ENCOUNTER — Ambulatory Visit: Payer: Medicaid Other | Admitting: Family Medicine

## 2016-10-10 ENCOUNTER — Encounter: Payer: Self-pay | Admitting: Emergency Medicine

## 2016-10-10 ENCOUNTER — Observation Stay
Admission: EM | Admit: 2016-10-10 | Discharge: 2016-10-11 | Disposition: A | Discharge status: Home / Self Care | Payer: Medicaid Other | Attending: Internal Medicine | Admitting: Internal Medicine

## 2016-10-10 DIAGNOSIS — E876 Hypokalemia: Principal | ICD-10-CM | POA: Insufficient documentation

## 2016-10-10 DIAGNOSIS — Z7982 Long term (current) use of aspirin: Secondary | ICD-10-CM | POA: Diagnosis not present

## 2016-10-10 DIAGNOSIS — J309 Allergic rhinitis, unspecified: Secondary | ICD-10-CM | POA: Insufficient documentation

## 2016-10-10 DIAGNOSIS — F17210 Nicotine dependence, cigarettes, uncomplicated: Secondary | ICD-10-CM | POA: Insufficient documentation

## 2016-10-10 DIAGNOSIS — K219 Gastro-esophageal reflux disease without esophagitis: Secondary | ICD-10-CM | POA: Insufficient documentation

## 2016-10-10 DIAGNOSIS — Z79891 Long term (current) use of opiate analgesic: Secondary | ICD-10-CM | POA: Insufficient documentation

## 2016-10-10 DIAGNOSIS — J449 Chronic obstructive pulmonary disease, unspecified: Secondary | ICD-10-CM | POA: Insufficient documentation

## 2016-10-10 DIAGNOSIS — E669 Obesity, unspecified: Secondary | ICD-10-CM | POA: Diagnosis not present

## 2016-10-10 DIAGNOSIS — Z6832 Body mass index (BMI) 32.0-32.9, adult: Secondary | ICD-10-CM | POA: Insufficient documentation

## 2016-10-10 DIAGNOSIS — Z880 Allergy status to penicillin: Secondary | ICD-10-CM | POA: Insufficient documentation

## 2016-10-10 DIAGNOSIS — I10 Essential (primary) hypertension: Secondary | ICD-10-CM | POA: Insufficient documentation

## 2016-10-10 DIAGNOSIS — E1142 Type 2 diabetes mellitus with diabetic polyneuropathy: Secondary | ICD-10-CM | POA: Diagnosis not present

## 2016-10-10 DIAGNOSIS — Z888 Allergy status to other drugs, medicaments and biological substances status: Secondary | ICD-10-CM | POA: Insufficient documentation

## 2016-10-10 DIAGNOSIS — Z7984 Long term (current) use of oral hypoglycemic drugs: Secondary | ICD-10-CM | POA: Insufficient documentation

## 2016-10-10 DIAGNOSIS — Z7951 Long term (current) use of inhaled steroids: Secondary | ICD-10-CM | POA: Diagnosis not present

## 2016-10-10 DIAGNOSIS — Z882 Allergy status to sulfonamides status: Secondary | ICD-10-CM | POA: Diagnosis not present

## 2016-10-10 DIAGNOSIS — Z79899 Other long term (current) drug therapy: Secondary | ICD-10-CM | POA: Diagnosis not present

## 2016-10-10 NOTE — ED Triage Notes (Signed)
Pt ambulatory to triage in NAD, reports low potassium reported by PCP, K 2.6.  Pt reports some cramping in hands and feet.

## 2016-10-10 NOTE — ED Notes (Signed)
Pt presents to ED 26 c/o low potassium (2.6) per PCP earlier today; pt is alert and oriented x4; able to speak in complete sentences; does not appear to be in acute distress at this time. Pt's lung sounds are clear bilaterally in all fields; pt's heart rate is WDL, pt had good peripheral pulses and cap refill of <3 sec.

## 2016-10-11 DIAGNOSIS — E876 Hypokalemia: Secondary | ICD-10-CM | POA: Diagnosis present

## 2016-10-11 LAB — BASIC METABOLIC PANEL
Anion gap: 11 (ref 5–15)
Anion gap: 8 (ref 5–15)
BUN: 11 mg/dL (ref 6–20)
BUN: 16 mg/dL (ref 6–20)
CALCIUM: 9.6 mg/dL (ref 8.9–10.3)
CHLORIDE: 104 mmol/L (ref 101–111)
CO2: 29 mmol/L (ref 22–32)
CO2: 29 mmol/L (ref 22–32)
CREATININE: 0.83 mg/dL (ref 0.44–1.00)
Calcium: 10 mg/dL (ref 8.9–10.3)
Chloride: 97 mmol/L — ABNORMAL LOW (ref 101–111)
Creatinine, Ser: 0.76 mg/dL (ref 0.44–1.00)
GFR calc Af Amer: >60 mL/min (ref >60)
GFR calc Af Amer: >60 mL/min (ref >60)
GFR calc non Af Amer: >60 mL/min (ref >60)
Glucose, Bld: 135 mg/dL — ABNORMAL HIGH (ref 65–99)
Glucose, Bld: 155 mg/dL — ABNORMAL HIGH (ref 65–99)
Potassium: 2.2 mmol/L — CL (ref 3.5–5.1)
Potassium: 3.1 mmol/L — ABNORMAL LOW (ref 3.5–5.1)
SODIUM: 137 mmol/L (ref 135–145)
Sodium: 141 mmol/L (ref 135–145)

## 2016-10-11 LAB — MAGNESIUM: MAGNESIUM: 2 mg/dL (ref 1.7–2.4)

## 2016-10-11 LAB — POTASSIUM: POTASSIUM: 3.2 mmol/L — AB (ref 3.5–5.1)

## 2016-10-11 MED ORDER — ONDANSETRON HCL 4 MG/2ML IJ SOLN: 4.0000 mg | Freq: Four times a day (QID) | INTRAMUSCULAR | Status: DC | PRN

## 2016-10-11 MED ORDER — POTASSIUM CHLORIDE 2 MEQ/ML IV SOLN
30.0000 meq | Freq: Once | INTRAVENOUS | Status: AC
  Administered 2016-10-11: 30 meq via INTRAVENOUS
  Filled 2016-10-11: qty 15

## 2016-10-11 MED ORDER — POTASSIUM CHLORIDE CRYS ER 20 MEQ PO TBCR
40.0000 meq | EXTENDED_RELEASE_TABLET | Freq: Once | ORAL | Status: AC
  Administered 2016-10-11: 40 meq via ORAL
  Filled 2016-10-11: qty 2

## 2016-10-11 MED ORDER — METFORMIN HCL 850 MG PO TABS
850.0000 mg | ORAL_TABLET | Freq: Every day | ORAL | Status: DC
  Filled 2016-10-11: qty 1

## 2016-10-11 MED ORDER — ACETAMINOPHEN 650 MG RE SUPP: 650.0000 mg | Freq: Four times a day (QID) | RECTAL | Status: DC | PRN

## 2016-10-11 MED ORDER — BUDESONIDE 0.25 MG/2ML IN SUSP
0.2500 mg | Freq: Two times a day (BID) | RESPIRATORY_TRACT | Status: DC
  Filled 2016-10-11: qty 2

## 2016-10-11 MED ORDER — ONDANSETRON HCL 4 MG PO TABS: 4.0000 mg | ORAL_TABLET | Freq: Four times a day (QID) | ORAL | Status: DC | PRN

## 2016-10-11 MED ORDER — DILTIAZEM HCL ER COATED BEADS 180 MG PO CP24
180.0000 mg | ORAL_CAPSULE | Freq: Every day | ORAL | Status: DC
  Filled 2016-10-11: qty 1

## 2016-10-11 MED ORDER — ASPIRIN EC 81 MG PO TBEC
81.0000 mg | DELAYED_RELEASE_TABLET | Freq: Every day | ORAL | Status: DC
  Administered 2016-10-11: 81 mg via ORAL
  Filled 2016-10-11: qty 1

## 2016-10-11 MED ORDER — PANTOPRAZOLE SODIUM 40 MG PO TBEC
40.0000 mg | DELAYED_RELEASE_TABLET | Freq: Two times a day (BID) | ORAL | Status: DC
  Administered 2016-10-11: 40 mg via ORAL
  Filled 2016-10-11: qty 1

## 2016-10-11 MED ORDER — BECLOMETHASONE DIPROPIONATE 80 MCG/ACT IN AERS: 2.0000 | INHALATION_SPRAY | Freq: Two times a day (BID) | RESPIRATORY_TRACT | Status: DC

## 2016-10-11 MED ORDER — ACETAMINOPHEN 325 MG PO TABS: 650.0000 mg | ORAL_TABLET | Freq: Four times a day (QID) | ORAL | Status: DC | PRN

## 2016-10-11 NOTE — H&P (Signed)
[facility] - [place] at [facility]   PATIENT NAME: Kristie Santana    MR#:  [id number]  DATE OF BIRTH:  [birth year]  DATE OF ADMISSION:  10/10/2016  PRIMARY CARE PHYSICIAN: [facility]   REQUESTING/REFERRING PHYSICIAN: Alphonzo Lemmings, MD  CHIEF COMPLAINT:   Chief Complaint  Patient presents with  . Abnormal Lab    HISTORY OF PRESENT ILLNESS:  Kristie Santana  is a 41 y.o. female who presents with Hypokalemia. Patient was called from her clinic where her lab was checked and told she had a potassium of 2.6 and told to go to the emergency department for the same. Here she was found to have potassium of 2.2. She denies any symptoms at this time. She is on chlorthalidone for blood pressure control.    PAST MEDICAL HISTORY:   Past Medical History:  Diagnosis Date  . Achilles tendinitis of right lower extremity   . Allergy   . Asthma   . Bilateral chronic knee pain   . Bilateral sacroiliitis (HCC)   . COPD (chronic obstructive pulmonary disease) (HCC)   . Diabetes mellitus without complication (HCC)   . Dysmetabolic syndrome   . History of abnormal cervical Pap smear   . Hypertension   . Left cervical lymphadenopathy     PAST SURGICAL HISTORY:   Past Surgical History:  Procedure Laterality Date  . CHOLECYSTECTOMY    . DILATION AND CURETTAGE OF UTERUS    . LEEP    . TUBAL LIGATION      SOCIAL HISTORY:   Social History  Substance Use Topics  . Smoking status: Current Some Day Smoker    Packs/day: 1.00    Years: 24.00    Types: Cigarettes    Start date: 02/23/1991  . Smokeless tobacco: Never Used  . Alcohol use 0.0 oz/week     Comment: occasionally    FAMILY HISTORY:   Family History  Problem Relation Age of Onset  . Hypertension Mother   . Hypertension Sister   . Diabetes Sister     DRUG ALLERGIES:   Allergies  Allergen Reactions  . Ace Inhibitors Cough  . Fish Allergy Itching and Swelling  . Latex   . Penicillins Hives     Has patient had a PCN reaction causing immediate rash, facial/tongue/throat swelling, SOB or lightheadedness with hypotension: Yes Has patient had a PCN reaction causing severe rash involving mucus membranes or skin necrosis: No Has patient had a PCN reaction that required hospitalization No Has patient had a PCN reaction occurring within the last 10 years: Yes If all of the above answers are "NO", then may proceed with Cephalosporin use.  Gaetana Michaelis Antibiotics Hives    MEDICATIONS AT HOME:   Prior to Admission medications   Medication Sig Start Date End Date Taking? Authorizing Provider  aspirin EC 81 MG tablet Take 1 tablet (81 mg total) by mouth daily. 12/19/15  Yes Alba Cory, MD  beclomethasone (QVAR) 80 MCG/ACT inhaler Inhale 2 puffs into the lungs 2 (two) times daily.   Yes Historical Provider, MD  CARTIA XT 180 MG 24 hr capsule Take 180 mg by mouth daily.   Yes Historical Provider, MD  chlorthalidone (HYGROTON) 25 MG tablet Take 1 tablet (25 mg total) by mouth daily. 12/19/15  Yes Alba Cory, MD  loratadine (CLARITIN) 10 MG tablet Take 1 tablet (10 mg total) by mouth daily. 08/31/15  Yes Alba Cory, MD  metFORMIN (GLUCOPHAGE) 850 MG tablet Take 1 tablet (850 mg  total) by mouth daily. 12/19/15  Yes Alba CoryKrichna Sowles, MD  omeprazole (PRILOSEC) 20 MG capsule Take 1 capsule (20 mg total) by mouth 2 (two) times daily. 12/19/15  Yes Alba CoryKrichna Sowles, MD  PROAIR HFA 108 [phone](90 Base) MCG/ACT inhaler inhale 2 puffs by mouth every 6 hours if needed for wheezing or shortness of breath 06/21/16  Yes Alba CoryKrichna Sowles, MD    REVIEW OF SYSTEMS:  Review of Systems  Constitutional: Negative for chills, fever, malaise/fatigue and weight loss.  HENT: Negative for ear pain, hearing loss and tinnitus.   Eyes: Negative for blurred vision, double vision, pain and redness.  Respiratory: Negative for cough, hemoptysis and shortness of breath.   Cardiovascular: Negative for chest pain, palpitations, orthopnea and  leg swelling.  Gastrointestinal: Negative for abdominal pain, constipation, diarrhea, nausea and vomiting.  Genitourinary: Negative for dysuria, frequency and hematuria.  Musculoskeletal: Negative for back pain, joint pain and neck pain.  Skin:       No acne, rash, or lesions  Neurological: Negative for dizziness, tremors, focal weakness and weakness.  Endo/Heme/Allergies: Negative for polydipsia. Does not bruise/bleed easily.  Psychiatric/Behavioral: Negative for depression. The patient is not nervous/anxious and does not have insomnia.      VITAL SIGNS:   Vitals:   10/10/16 2254 10/10/16 2354 10/11/16 0052 10/11/16 0100  BP: (!) 156/77 (!) 143/89 (!) 151/74 135/74  Pulse: (!) 103 89 99 95  Resp: 16 18 16 16   Temp: 98.4 F (36.9 C)     TempSrc: Oral     SpO2: 97% 100% 100% 100%  Weight: 89.4 kg (197 lb)     Height: 5\' 5"  (1.651 m)      Wt Readings from Last 3 Encounters:  10/10/16 89.4 kg (197 lb)  02/09/16 94.3 kg (208 lb)  01/19/16 94.3 kg (208 lb)    PHYSICAL EXAMINATION:  Physical Exam  Vitals reviewed. Constitutional: She is oriented to person, place, and time. She appears well-developed and well-nourished. No distress.  HENT:  Head: Normocephalic and atraumatic.  Mouth/Throat: Oropharynx is clear and moist.  Eyes: Conjunctivae and EOM are normal. Pupils are equal, round, and reactive to light. No scleral icterus.  Neck: Normal range of motion. Neck supple. No JVD present. No thyromegaly present.  Cardiovascular: Normal rate, regular rhythm and intact distal pulses.  Exam reveals no gallop and no friction rub.   No murmur heard. Respiratory: Effort normal and breath sounds normal. No respiratory distress. She has no wheezes. She has no rales.  GI: Soft. Bowel sounds are normal. She exhibits no distension. There is no tenderness.  Musculoskeletal: Normal range of motion. She exhibits no edema.  No arthritis, no gout  Lymphadenopathy:    She has no cervical  adenopathy.  Neurological: She is alert and oriented to person, place, and time. No cranial nerve deficit.  No dysarthria, no aphasia  Skin: Skin is warm and dry. No rash noted. No erythema.  Psychiatric: She has a normal mood and affect. Her behavior is normal. Judgment and thought content normal.    LABORATORY PANEL:   CBC No results for input(s): WBC, HGB, HCT, PLT in the last 168 hours. ------------------------------------------------------------------------------------------------------------------  Chemistries   Recent Labs Lab 10/10/16 2349  NA 137  K 2.2*  CL 97*  CO2 29  GLUCOSE 135*  BUN 16  CREATININE 0.83  CALCIUM 10.0  MG 2.0   ------------------------------------------------------------------------------------------------------------------  Cardiac Enzymes No results for input(s): TROPONINI in the last 168 hours. ------------------------------------------------------------------------------------------------------------------  RADIOLOGY:  No results  found.  EKG:   Orders placed or performed during the hospital encounter of 10/10/16  . ED EKG  . 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 Management plans discussed with the patient and/or family.  DVT PROPHYLAXIS: Mechanical only  GI PROPHYLAXIS: None  ADMISSION STATUS: Observation  CODE STATUS: Full Code Status History    This patient does not have a recorded code status. Please follow your organizational policy for patients in this situation.      TOTAL TIME TAKING CARE OF THIS PATIENT: 40 minutes.    Maloree Uplinger FIELDING 10/11/2016, 1:20  AM  [facility]  Office  [phone]  CC: Primary care physician; [facility]

## 2016-10-11 NOTE — Discharge Instructions (Signed)
Heart healthy and ADA diet. °Smoking cessation. °

## 2016-10-11 NOTE — ED Provider Notes (Addendum)
[facility] Emergency Department Provider Note  ____________________________________________   I have reviewed the triage vital signs and the nursing notes.   HISTORY  Chief Complaint Abnormal Lab    HPI Kristie Santana is a 41 y.o. female presents today with no complaint at all. She states several weeks ago she had some cramps may be several months ago. However, she did have routine blood work done for the first time in a while and it was noted that she had low potassium. Patient takes what she believes to be hydrochlorothiazide. She is not on supplementation. She has absolutely no symptoms with this. However it was noted that her potassium was 2.6 and she was sent in here for further evaluation. She denies chest pain body aches or any other complaints     Past Medical History:  Diagnosis Date  . Achilles tendinitis of right lower extremity   . Allergy   . Asthma   . Bilateral chronic knee pain   . Bilateral sacroiliitis (HCC)   . COPD (chronic obstructive pulmonary disease) (HCC)   . Diabetes mellitus without complication (HCC)   . Dysmetabolic syndrome   . History of abnormal cervical Pap smear   . Hypertension   . Left cervical lymphadenopathy     Patient Active Problem List   Diagnosis Date Noted  . Knee pain, bilateral 11/21/2015  . OA (osteoarthritis) of knee 11/21/2015  . Back pain at L4-L5 level 07/07/2015  . DDD (degenerative disc disease), lumbar 07/07/2015  . Lumbar radiculopathy 07/07/2015  . Streptococcal pharyngitis 03/20/2015  . Achilles tendinitis 02/22/2015  . CAFL (chronic airflow limitation) (HCC) 02/22/2015  . Benign hypertension 02/22/2015  . Chronic pain 02/22/2015  . Inflammation of sacroiliac joint (HCC) 02/22/2015  . 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 Dyslipidemia 02/22/2015  . Dysmetabolic syndrome 02/22/2015  . H/O abnormal cervical Papanicolaou smear 02/22/2015   . H/O renal calculi 02/22/2015  . History of methicillin resistant Staphylococcus aureus infection 02/22/2015  . Allergic rhinitis 02/22/2015  . Compulsive tobacco user syndrome 02/22/2015  . 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 Obesity (BMI 30-39.9) 02/22/2015  . Gastro-esophageal reflux disease without esophagitis 09/30/2007    Past Surgical History:  Procedure Laterality Date  . CHOLECYSTECTOMY    . DILATION AND CURETTAGE OF UTERUS    . LEEP    . TUBAL LIGATION      Prior to Admission medications   Medication Sig Start Date End Date Taking? Authorizing Provider  aspirin EC 81 MG tablet Take 1 tablet (81 mg total) by mouth daily. 12/19/15   Alba CoryKrichna Sowles, MD  chlorthalidone (HYGROTON) 25 MG tablet Take 1 tablet (25 mg total) by mouth daily. 12/19/15   Alba CoryKrichna Sowles, MD  DULoxetine (CYMBALTA) 30 MG capsule Take 1 capsule (30 mg total) by mouth daily. For the first week after that 2 daily 12/19/15   Alba CoryKrichna Sowles, MD  Fluticasone-Salmeterol (ADVAIR) 100-50 MCG/DOSE AEPB Inhale 1 puff into the lungs 2 (two) times daily. 12/19/15   Alba CoryKrichna Sowles, MD  gabapentin (NEURONTIN) 100 MG capsule Take 1 capsule (100 mg total) by mouth 3 (three) times daily. 12/22/15   Tod PersiaWinston Parris, MD  HYDROcodone-acetaminophen (HYCET) 7.5-325 mg/15 ml solution Reported on 02/09/2016 09/26/15   Historical Provider, MD  HYDROcodone-acetaminophen (NORCO/VICODIN) 5-325 MG tablet Take 1 tablet by mouth 1 day or 1 dose. 02/09/16   Tod PersiaWinston Parris, MD  loratadine (CLARITIN) 10 MG tablet Take 1 tablet (10 mg total) by mouth daily.  08/31/15   Alba Cory, MD  metFORMIN (GLUCOPHAGE) 850 MG tablet Take 1 tablet (850 mg total) by mouth daily. 12/19/15   Alba Cory, MD  omeprazole (PRILOSEC) 20 MG capsule Take 1 capsule (20 mg total) by mouth 2 (two) times daily. 12/19/15   Alba Cory, MD  PROAIR HFA 108 (90 Base) MCG/ACT inhaler inhale 2 puffs by mouth every 6 hours if needed for wheezing or shortness of breath 06/21/16    Alba Cory, MD    Allergies Ace inhibitors; Fish allergy; Latex; Penicillins; and Sulfa antibiotics  Family History  Problem Relation Age of Onset  . Hypertension Mother   . Hypertension Sister   . Diabetes Sister     Social History Social History  Substance Use Topics  . Smoking status: Current Some Day Smoker    Packs/day: 1.00    Years: 24.00    Types: Cigarettes    Start date: 02/23/1991  . Smokeless tobacco: Never Used  . Alcohol use 0.0 oz/week     Comment: occasionally    Review of Systems Constitutional: No fever/chills Eyes: No visual changes. ENT: No sore throat. No stiff neck no neck pain Cardiovascular: Denies chest pain. Respiratory: Denies shortness of breath. Gastrointestinal:   no vomiting.  No diarrhea.  No constipation. Genitourinary: Negative for dysuria. Musculoskeletal: Negative lower extremity swelling Skin: Negative for rash. Neurological: Negative for severe headaches, focal weakness or numbness. 10-point ROS otherwise negative.  ____________________________________________   PHYSICAL EXAM:  VITAL SIGNS: ED Triage Vitals  Enc Vitals Group     BP 10/10/16 2254 (!) 156/77     Pulse Rate 10/10/16 2254 (!) 103     Resp 10/10/16 2254 16     Temp 10/10/16 2254 98.4 F (36.9 C)     Temp Source 10/10/16 2254 Oral     SpO2 10/10/16 2254 97 %     Weight 10/10/16 2254 197 lb (89.4 kg)     Height 10/10/16 2254 5\' 5"  (1.651 m)     Head Circumference --      Peak Flow --      Pain Score 10/10/16 2255 0     Pain Loc --      Pain Edu? --      Excl. in GC? --     Constitutional: Alert and oriented. Well appearing and in no acute distress. Eyes: Conjunctivae are normal. PERRL. EOMI. Head: Atraumatic. Nose: No congestion/rhinnorhea. Mouth/Throat: Mucous membranes are moist.  Oropharynx non-erythematous. Neck: No stridor.   Nontender with no meningismus Cardiovascular: Normal rate, regular rhythm. Grossly normal heart sounds.  Good  peripheral circulation. Respiratory: Normal respiratory effort.  No retractions. Lungs CTAB. Abdominal: Soft and nontender. No distention. No guarding no rebound Back:  There is no focal tenderness or step off.  there is no midline tenderness there are no lesions noted. there is no CVA tenderness Musculoskeletal: No lower extremity tenderness, no upper extremity tenderness. No joint effusions, no DVT signs strong distal pulses no edema Neurologic:  Normal speech and language. No gross focal neurologic deficits are appreciated.  Skin:  Skin is warm, dry and intact. No rash noted. Psychiatric: Mood and affect are normal. Speech and behavior are normal.  ____________________________________________   LABS (all labs ordered are listed, but only abnormal results are displayed)  Labs Reviewed  BASIC METABOLIC PANEL  MAGNESIUM   ____________________________________________  EKG  I personally interpreted any EKGs ordered by me or triage Sinus tach rate 110 with normal axis, no acute ST elevation or  depression nonspecific ST changes, multiple PVCs noted. ____________________________________________  RADIOLOGY  I reviewed any imaging ordered by me or triage that were performed during my shift and, if possible, patient and/or family made aware of any abnormal findings. ____________________________________________   PROCEDURES  Procedure(s) performed: None  Procedures  Critical Care performed: None  ____________________________________________   INITIAL IMPRESSION / ASSESSMENT AND PLAN / ED COURSE  Pertinent labs & imaging results that were available during my care of the patient were reviewed by me and considered in my medical decision making (see chart for details).  She was essentially asymptomatic but according to reports her potassium is very low. We will recheck that prior to emergently giving her potassium and reassess.  ----------------------------------------- 12:38 AM  on 10/11/2016 -----------------------------------------  Testing is dangerously low, we are beginning emergent replacement she will need to be admitted    ____________________________________________   FINAL CLINICAL IMPRESSION(S) / ED DIAGNOSES  Final diagnoses:  None      This chart was dictated using voice recognition software.  Despite best efforts to proofread,  errors can occur which can change meaning.      Jeanmarie Plant, MD 10/11/16 0002    Jeanmarie Plant, MD 10/11/16 [phone]

## 2016-10-11 NOTE — Progress Notes (Signed)
Dc instructions given, verbalizes understanding. Dc home via w/c. Understand meds and follow up appt.

## 2016-10-11 NOTE — Progress Notes (Signed)
Spoke to dr Imogene Burnchen. K+ 3.2 order to give 40meq more and then dc home

## 2016-10-11 NOTE — Discharge Summary (Signed)
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 Achilles tendinitis of right lower extremity   . Allergy   . Asthma   . Bilateral chronic knee pain   . Bilateral sacroiliitis (HCC)   . COPD (chronic obstructive pulmonary disease) (HCC)   . Diabetes mellitus without complication (HCC)   . Dysmetabolic syndrome   . History of abnormal cervical Pap smear   . Hypertension   . Left cervical lymphadenopathy    HOSPITAL COURSE:  Hypokalemia - patient was given potassium repletion, repeat K 3.2. Given another dose of KCl 40 mEq. Discontinue chlorthalidone. type 2 diabetes mellitus with peripheral neuropathy (HCC) - continue home meds Gastro-esophageal reflux disease without esophagitis - continue home meds HTN. Hold chlorthalidone, continue Cardizem. Tobacco abuse. Smoking cessation was counseled for 4 minutes.  DISCHARGE CONDITIONS:  Stable, discharge to home today. CONSULTS OBTAINED:   DRUG ALLERGIES:   Allergies  Allergen Reactions  . Ace Inhibitors Cough  . Fish Allergy Itching and Swelling  . Latex   . Penicillins Hives    Has patient had a PCN reaction causing immediate rash, facial/tongue/throat swelling, SOB or lightheadedness with hypotension: Yes Has patient had a PCN reaction causing severe rash involving mucus membranes or skin necrosis: No Has patient had a PCN reaction that required hospitalization No Has patient  had a PCN reaction occurring within the last 10 years: Yes If all of the above answers are "NO", then may proceed with Cephalosporin use.  . Sulfa Antibiotics Hives   DISCHARGE MEDICATIONS:   Allergies as of 10/11/2016      Reactions   Ace Inhibitors Cough   Fish Allergy Itching, Swelling   Latex    Penicillins Hives   Has patient had a PCN reaction causing immediate rash, facial/tongue/throat swelling, SOB or lightheadedness with hypotension: Yes Has patient had a PCN reaction causing severe rash involving mucus membranes or skin necrosis: No Has patient had a PCN reaction that required hospitalization No Has patient had a PCN reaction occurring within the last 10 years: Yes If all of the above answers are "NO", then may proceed with Cephalosporin use.   Sulfa Antibiotics Hives      Medication List    STOP taking these medications   chlorthalidone 25 MG tablet Commonly known as:  HYGROTON     TAKE these medications   aspirin EC 81 MG tablet Take 1 tablet (81 mg total) by mouth daily.   beclomethasone 80 MCG/ACT inhaler Commonly known as:  QVAR Inhale 2 puffs into the lungs 2 (two) times daily.   CARTIA XT 180 MG 24 hr capsule Generic drug:  diltiazem Take 180 mg by mouth daily.   loratadine 10 MG tablet Commonly known as:  CLARITIN Take 1 tablet (10 mg total) by mouth daily.   metFORMIN 850 MG tablet Commonly known as:  GLUCOPHAGE Take 1 tablet (850 mg total) by mouth daily.   omeprazole 20 MG capsule Commonly known as:  PRILOSEC Take 1 capsule (20 mg total) by mouth 2 (two) times daily.   PROAIR HFA 108 (90 Base) MCG/ACT inhaler Generic drug:  albuterol inhale 2 puffs by mouth every 6 hours if needed for wheezing or shortness of breath        DISCHARGE INSTRUCTIONS:  See AVS.  If you experience worsening of your admission symptoms, develop shortness of breath, life threatening emergency, suicidal or homicidal thoughts you must seek medical attention  immediately by calling 911 or calling your MD immediately  if symptoms less severe.  You Must read complete instructions/literature along with all the possible adverse reactions/side effects for all the Medicines you take and that have been prescribed to you. Take any new Medicines after you have completely understood and accpet all the possible adverse reactions/side effects.   Please note  You were cared for by a hospitalist during your hospital stay. If you have any questions about your discharge medications or the care you received while you were in the hospital after you are discharged, you can call the unit and asked to speak with the hospitalist on call if the hospitalist that took care of you is not available. Once you are discharged, your primary care physician will handle any further medical issues. Please note that NO REFILLS for any discharge medications will be authorized once you are discharged, as it is imperative that you return to your primary care physician (or establish a relationship with a primary care physician if you do not have one) for your aftercare needs so that they can reassess your need for medications and monitor your lab values.    On the day of Discharge:  VITAL SIGNS:  Blood pressure 106/66, pulse 77, temperature 98.2 F (36.8 C), temperature source Oral, resp. rate 18, height 5\' 5"  (1.651 m), weight 197 lb (89.4 kg), last menstrual period 10/03/2016, SpO2 98 %. PHYSICAL EXAMINATION:  GENERAL:  41 y.o.-year-old patient lying in the bed with no acute distress.  EYES: Pupils equal, round, reactive to light and accommodation. No scleral icterus. Extraocular muscles intact.  HEENT: Head atraumatic, normocephalic. Oropharynx and nasopharynx clear.  NECK:  Supple, no jugular venous distention. No thyroid enlargement, no tenderness.  LUNGS: Normal breath sounds bilaterally, no wheezing, rales,rhonchi or crepitation. No use of accessory muscles of respiration.    CARDIOVASCULAR: S1, S2 normal. No murmurs, rubs, or gallops.  ABDOMEN: Soft, non-tender, non-distended. Bowel sounds present. No organomegaly or mass.  EXTREMITIES: No pedal edema, cyanosis, or clubbing.  NEUROLOGIC: Cranial nerves II through XII are intact. Muscle strength 5/5 in all extremities. Sensation intact. Gait not checked.  PSYCHIATRIC: The patient is alert and oriented x 3.  SKIN: No obvious rash, lesion, or ulcer.  DATA REVIEW:   CBC No results for input(s): WBC, HGB, HCT, PLT in the last 168 hours.  Chemistries   Recent Labs Lab 10/10/16 2349 10/11/16 0505 10/11/16 1406  NA 137 141  --   K 2.2* 3.1* 3.2*  CL 97* 104  --   CO2 29 29  --   GLUCOSE 135* 155*  --   BUN 16 11  --   CREATININE 0.83 0.76  --   CALCIUM 10.0 9.6  --   MG 2.0  --   --      Microbiology Results  Results for orders placed or performed in visit on 08/31/15  Urine Culture     Status: None   Collection Time: 08/31/15 12:00 AM  Result Value Ref Range Status  Urine Culture, Routine Final report  Final   Urine Culture result 1 Comment  Final    Comment: Culture shows less than 10,000 colony forming units of bacteria per milliliter of urine. This colony count is not generally considered to be clinically significant.   Chlamydia/Gonococcus/Trichomonas, NAA     Status: None   Collection Time: 08/31/15 12:00 AM  Result Value Ref Range Status   Chlamydia by NAA Negative Negative Final   Gonococcus by NAA Negative Negative Final   Trich vag by NAA Negative Negative Final    RADIOLOGY:  No results found.   Management plans discussed with the patient, family and they are in agreement.  CODE STATUS: Full Code   TOTAL TIME TAKING CARE OF THIS PATIENT: 25 minutes.    Shaune Pollack M.D on 10/11/2016 at 3:17 PM  Between 7am to 6pm - Pager - [phone]  After 6pm go to [email or web address] - Social research officer, government  [facility]  Office  [phone]  CC: Primary care  physician; [facility]   Note: This dictation was prepared with Dragon dictation along with smaller phrase technology. Any transcriptional errors that result from this process are unintentional.

## 2016-12-05 ENCOUNTER — Emergency Department
Admission: EM | Admit: 2016-12-05 | Discharge: 2016-12-05 | Disposition: A | Discharge status: Home / Self Care | Payer: Medicaid Other | Attending: Emergency Medicine | Admitting: Emergency Medicine

## 2016-12-05 ENCOUNTER — Encounter: Payer: Self-pay | Admitting: Emergency Medicine

## 2016-12-05 DIAGNOSIS — I10 Essential (primary) hypertension: Secondary | ICD-10-CM | POA: Insufficient documentation

## 2016-12-05 DIAGNOSIS — Z79899 Other long term (current) drug therapy: Secondary | ICD-10-CM | POA: Insufficient documentation

## 2016-12-05 DIAGNOSIS — J45909 Unspecified asthma, uncomplicated: Secondary | ICD-10-CM | POA: Diagnosis not present

## 2016-12-05 DIAGNOSIS — J449 Chronic obstructive pulmonary disease, unspecified: Secondary | ICD-10-CM | POA: Diagnosis not present

## 2016-12-05 DIAGNOSIS — E119 Type 2 diabetes mellitus without complications: Secondary | ICD-10-CM | POA: Diagnosis not present

## 2016-12-05 DIAGNOSIS — F17210 Nicotine dependence, cigarettes, uncomplicated: Secondary | ICD-10-CM | POA: Insufficient documentation

## 2016-12-05 DIAGNOSIS — Z7982 Long term (current) use of aspirin: Secondary | ICD-10-CM | POA: Diagnosis not present

## 2016-12-05 DIAGNOSIS — Z7984 Long term (current) use of oral hypoglycemic drugs: Secondary | ICD-10-CM | POA: Diagnosis not present

## 2016-12-05 LAB — URINALYSIS, COMPLETE (UACMP) WITH MICROSCOPIC
BILIRUBIN URINE: NEGATIVE
Glucose, UA: NEGATIVE mg/dL
Hgb urine dipstick: NEGATIVE
KETONES UR: NEGATIVE mg/dL
Leukocytes, UA: NEGATIVE
Nitrite: NEGATIVE
PH: 6 (ref 5.0–8.0)
Protein, ur: NEGATIVE mg/dL
SPECIFIC GRAVITY, URINE: 1.018 (ref 1.005–1.030)

## 2016-12-05 LAB — BASIC METABOLIC PANEL
ANION GAP: 7 (ref 5–15)
BUN: 13 mg/dL (ref 6–20)
CO2: 23 mmol/L (ref 22–32)
Calcium: 9.6 mg/dL (ref 8.9–10.3)
Chloride: 106 mmol/L (ref 101–111)
Creatinine, Ser: 0.76 mg/dL (ref 0.44–1.00)
Glucose, Bld: 122 mg/dL — ABNORMAL HIGH (ref 65–99)
POTASSIUM: 3.3 mmol/L — AB (ref 3.5–5.1)
SODIUM: 136 mmol/L (ref 135–145)

## 2016-12-05 LAB — CBC WITH DIFFERENTIAL/PLATELET
BASOS ABS: 0 10*3/uL (ref 0–0.1)
BASOS PCT: 1 %
EOS ABS: 0.1 10*3/uL (ref 0–0.7)
Eosinophils Relative: 1 %
HCT: 37.2 % (ref 35.0–47.0)
HEMOGLOBIN: 12.4 g/dL (ref 12.0–16.0)
LYMPHS ABS: 2.7 10*3/uL (ref 1.0–3.6)
Lymphocytes Relative: 45 %
MCH: 27.9 pg (ref 26.0–34.0)
MCHC: 33.5 g/dL (ref 32.0–36.0)
MCV: 83.3 fL (ref 80.0–100.0)
Monocytes Absolute: 0.7 10*3/uL (ref 0.2–0.9)
Monocytes Relative: 12 %
NEUTROS PCT: 41 %
Neutro Abs: 2.4 10*3/uL (ref 1.4–6.5)
Platelets: 341 10*3/uL (ref 150–440)
RBC: 4.46 MIL/uL (ref 3.80–5.20)
RDW: 16.1 % — ABNORMAL HIGH (ref 11.5–14.5)
WBC: 5.8 10*3/uL (ref 3.6–11.0)

## 2016-12-05 LAB — TROPONIN I

## 2016-12-05 NOTE — ED Notes (Signed)
Pt also requests to be evaluated for UTI because "sometimes I have to pee a lot and I do hold my urine because I have to sometimes."

## 2016-12-05 NOTE — ED Provider Notes (Signed)
[facility] Emergency Department Provider Note       Time seen: ----------------------------------------- 8:55 AM on 12/05/2016 -----------------------------------------     I have reviewed the triage vital signs and the nursing notes.   HISTORY   Chief Complaint Hypertension    HPI DOSIA YODICE is a 41 y.o. female who presents to the ED for hypertension. She was encouraged comes to the ER by her primary care doctor. She denies any fevers, chills, chest pain, shortness of breath, vomiting or diarrhea. Recently she has had some electrolyte abnormality secondary to her high blood pressure medication. She has started back on her blood pressure medicine after being off of it for 2 months and has restarted her potassium supplements.   Past Medical History:  Diagnosis Date  . Achilles tendinitis of right lower extremity   . Allergy   . Asthma   . Bilateral chronic knee pain   . Bilateral sacroiliitis (HCC)   . COPD (chronic obstructive pulmonary disease) (HCC)   . Diabetes mellitus without complication (HCC)   . Dysmetabolic syndrome   . History of abnormal cervical Pap smear   . Hypertension   . Left cervical lymphadenopathy     Patient Active Problem List   Diagnosis Date Noted  . Hypokalemia 10/11/2016  . Knee pain, bilateral 11/21/2015  . OA (osteoarthritis) of knee 11/21/2015  . Back pain at L4-L5 level 07/07/2015  . DDD (degenerative disc disease), lumbar 07/07/2015  . Lumbar radiculopathy 07/07/2015  . Streptococcal pharyngitis 03/20/2015  . Achilles tendinitis 02/22/2015  . CAFL (chronic airflow limitation) (HCC) 02/22/2015  . Benign hypertension 02/22/2015  . Chronic pain 02/22/2015  . Inflammation of sacroiliac joint (HCC) 02/22/2015  . 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 Dyslipidemia 02/22/2015  . Dysmetabolic syndrome 02/22/2015  . H/O abnormal cervical Papanicolaou smear  02/22/2015  . H/O renal calculi 02/22/2015  . History of methicillin resistant Staphylococcus aureus infection 02/22/2015  . Allergic rhinitis 02/22/2015  . Compulsive tobacco user syndrome 02/22/2015  . 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 Obesity (BMI 30-39.9) 02/22/2015  . Gastro-esophageal reflux disease without esophagitis 09/30/2007    Past Surgical History:  Procedure Laterality Date  . CHOLECYSTECTOMY    . DILATION AND CURETTAGE OF UTERUS    . LEEP    . TUBAL LIGATION      Allergies Ace inhibitors; Fish allergy; Latex; Penicillins; and Sulfa antibiotics  Social History Social History  Substance Use Topics  . Smoking status: Current Every Day Smoker    Packs/day: 1.00    Years: 24.00    Types: Cigarettes    Start date: 02/23/1991  . Smokeless tobacco: Never Used  . Alcohol use 0.0 oz/week     Comment: occasionally    Review of Systems Constitutional: Negative for fever. Cardiovascular: Negative for chest pain. Respiratory: Negative for shortness of breath. Gastrointestinal: Negative for abdominal pain, vomiting and diarrhea. Genitourinary: Negative for dysuria. Musculoskeletal: Negative for back pain. Skin: Negative for rash. Neurological: Negative for headaches, focal weakness or numbness.  10-point ROS otherwise negative.  ____________________________________________   PHYSICAL EXAM:  VITAL SIGNS: ED Triage Vitals  Enc Vitals Group     BP 12/05/16 0847 (!) 174/94     Pulse Rate 12/05/16 0847 (!) 123     Resp --      Temp 12/05/16 0847 97.7 F (36.5 C)     Temp Source 12/05/16 0847 Oral     SpO2 12/05/16 0847 100 %  Weight 12/05/16 0848 200 lb (90.7 kg)     Height 12/05/16 0848  (1.651 m)     Head Circumference --      Peak Flow --      Pain Score --      Pain Loc --      Pain Edu? --      Excl. in GC? --     Constitutional: Alert and oriented. Well appearing and in no distress. Eyes: Conjunctivae are normal. PERRL. Normal  extraocular movements. ENT   Head: Normocephalic and atraumatic.   Nose: No congestion/rhinnorhea.   Mouth/Throat: Mucous membranes are moist.   Neck: No stridor. Cardiovascular: Normal rate, regular rhythm. No murmurs, rubs, or gallops. Respiratory: Normal respiratory effort without tachypnea nor retractions. Breath sounds are clear and equal bilaterally. No wheezes/rales/rhonchi. Gastrointestinal: Soft and nontender. Normal bowel sounds Musculoskeletal: Nontender with normal range of motion in extremities. No lower extremity tenderness nor edema. Neurologic:  Normal speech and language. No gross focal neurologic deficits are appreciated.  Skin:  Skin is warm, dry and intact. No rash noted. Psychiatric: Mood and affect are normal. Speech and behavior are normal.  ____________________________________________  EKG: Interpreted by me. Sinus rhythm rate 95 bpm, normal PR interval, normal QRS, long QT, normal axis.  ____________________________________________  ED COURSE:  Pertinent labs & imaging results that were available during my care of the patient were reviewed by me and considered in my medical decision making (see chart for details). Patient presents for hypertension, we will assess with labs and imaging as indicated.   Procedures ____________________________________________   LABS (pertinent positives/negatives)  Labs Reviewed  URINALYSIS, COMPLETE (UACMP) WITH MICROSCOPIC - Abnormal; Notable for the following:       Result Value   Color, Urine YELLOW (*)    APPearance CLOUDY (*)    Bacteria, UA RARE (*)    Squamous Epithelial / LPF 6-30 (*)    All other components within normal limits  CBC WITH DIFFERENTIAL/PLATELET - Abnormal; Notable for the following:    RDW 16.1 (*)    All other components within normal limits  BASIC METABOLIC PANEL - Abnormal; Notable for the following:    Potassium 3.3 (*)    Glucose, Bld 122 (*)    All other components within  normal limits  TROPONIN I   ____________________________________________  FINAL ASSESSMENT AND PLAN  Hypertension  Plan: Patient's labs were dictated above. Patient had presented for Hypertension which seems to be relatively under control. Labs are reassuring, she is encouraged to continue her medications as prescribed. She is stable for outpatient follow-up.   Emily Filbert, MD   Note: This note was generated in part or whole with voice recognition software. Voice recognition is usually quite accurate but there are transcription errors that can and very often do occur. I apologize for any typographical errors that were not detected and corrected.     Emily Filbert, MD 12/05/16 1005

## 2016-12-05 NOTE — ED Triage Notes (Signed)
Pt presents with hypertension. States she was taken off her htn medication in February; has had issues with pcp appointments, decided to start taking her htn medication again after she had 2 high readings on Friday and yesterday. (176/86).   Pt alert & oriented with NAD noted.

## 2018-04-14 ENCOUNTER — Emergency Department: Payer: Medicaid Other

## 2018-04-14 ENCOUNTER — Other Ambulatory Visit: Payer: Self-pay

## 2018-04-14 ENCOUNTER — Emergency Department
Admission: EM | Admit: 2018-04-14 | Discharge: 2018-04-14 | Disposition: A | Discharge status: Home / Self Care | Payer: Medicaid Other | Attending: Emergency Medicine | Admitting: Emergency Medicine

## 2018-04-14 DIAGNOSIS — Z79899 Other long term (current) drug therapy: Secondary | ICD-10-CM | POA: Diagnosis not present

## 2018-04-14 DIAGNOSIS — J449 Chronic obstructive pulmonary disease, unspecified: Secondary | ICD-10-CM | POA: Diagnosis not present

## 2018-04-14 DIAGNOSIS — N898 Other specified noninflammatory disorders of vagina: Secondary | ICD-10-CM | POA: Insufficient documentation

## 2018-04-14 DIAGNOSIS — Z3202 Encounter for pregnancy test, result negative: Secondary | ICD-10-CM | POA: Diagnosis not present

## 2018-04-14 DIAGNOSIS — Z7982 Long term (current) use of aspirin: Secondary | ICD-10-CM | POA: Insufficient documentation

## 2018-04-14 DIAGNOSIS — Z91040 Latex allergy status: Secondary | ICD-10-CM | POA: Insufficient documentation

## 2018-04-14 DIAGNOSIS — F17210 Nicotine dependence, cigarettes, uncomplicated: Secondary | ICD-10-CM | POA: Insufficient documentation

## 2018-04-14 DIAGNOSIS — Z7984 Long term (current) use of oral hypoglycemic drugs: Secondary | ICD-10-CM | POA: Insufficient documentation

## 2018-04-14 DIAGNOSIS — R102 Pelvic and perineal pain: Secondary | ICD-10-CM | POA: Insufficient documentation

## 2018-04-14 DIAGNOSIS — I10 Essential (primary) hypertension: Secondary | ICD-10-CM | POA: Insufficient documentation

## 2018-04-14 DIAGNOSIS — E119 Type 2 diabetes mellitus without complications: Secondary | ICD-10-CM | POA: Insufficient documentation

## 2018-04-14 LAB — URINALYSIS, COMPLETE (UACMP) WITH MICROSCOPIC
Bacteria, UA: NONE SEEN
Bilirubin Urine: NEGATIVE
Glucose, UA: NEGATIVE mg/dL
Hgb urine dipstick: NEGATIVE
Ketones, ur: NEGATIVE mg/dL
Leukocytes, UA: NEGATIVE
Nitrite: NEGATIVE
Protein, ur: NEGATIVE mg/dL
Specific Gravity, Urine: 1.005 (ref 1.005–1.030)
pH: 6 (ref 5.0–8.0)

## 2018-04-14 LAB — WET PREP, GENITAL
Sperm: NONE SEEN
Trich, Wet Prep: NONE SEEN
WBC, Wet Prep HPF POC: NONE SEEN
Yeast Wet Prep HPF POC: NONE SEEN

## 2018-04-14 LAB — CBC WITH DIFFERENTIAL/PLATELET
Basophils Absolute: 0 10*3/uL (ref 0–0.1)
Basophils Relative: 0 %
Eosinophils Absolute: 0 10*3/uL (ref 0–0.7)
Eosinophils Relative: 1 %
HCT: 34.6 % — ABNORMAL LOW (ref 35.0–47.0)
Hemoglobin: 11.7 g/dL — ABNORMAL LOW (ref 12.0–16.0)
Lymphocytes Relative: 36 %
Lymphs Abs: 2.6 10*3/uL (ref 1.0–3.6)
MCH: 27 pg (ref 26.0–34.0)
MCHC: 33.9 g/dL (ref 32.0–36.0)
MCV: 79.6 fL — ABNORMAL LOW (ref 80.0–100.0)
Monocytes Absolute: 0.6 10*3/uL (ref 0.2–0.9)
Monocytes Relative: 8 %
Neutro Abs: 3.9 10*3/uL (ref 1.4–6.5)
Neutrophils Relative %: 55 %
Platelets: 357 10*3/uL (ref 150–440)
RBC: 4.35 MIL/uL (ref 3.80–5.20)
RDW: 16.1 % — ABNORMAL HIGH (ref 11.5–14.5)
WBC: 7.2 10*3/uL (ref 3.6–11.0)

## 2018-04-14 LAB — COMPREHENSIVE METABOLIC PANEL
ALT: 14 U/L (ref 0–44)
AST: 20 U/L (ref 15–41)
Albumin: 4.3 g/dL (ref 3.5–5.0)
Alkaline Phosphatase: 77 U/L (ref 38–126)
Anion gap: 8 (ref 5–15)
BUN: 13 mg/dL (ref 6–20)
CO2: 25 mmol/L (ref 22–32)
Calcium: 9.4 mg/dL (ref 8.9–10.3)
Chloride: 106 mmol/L (ref 98–111)
Creatinine, Ser: 0.82 mg/dL (ref 0.44–1.00)
GFR calc Af Amer: >60 mL/min (ref >60)
GFR calc non Af Amer: >60 mL/min (ref >60)
Glucose, Bld: 103 mg/dL — ABNORMAL HIGH (ref 70–99)
Potassium: 3.1 mmol/L — ABNORMAL LOW (ref 3.5–5.1)
Sodium: 139 mmol/L (ref 135–145)
Total Bilirubin: 0.4 mg/dL (ref 0.3–1.2)
Total Protein: 7.7 g/dL (ref 6.5–8.1)

## 2018-04-14 LAB — POCT PREGNANCY, URINE: PREG TEST UR: NEGATIVE

## 2018-04-14 LAB — CHLAMYDIA/NGC RT PCR (ARMC ONLY)
Chlamydia Tr: NOT DETECTED
N gonorrhoeae: NOT DETECTED

## 2018-04-14 MED ORDER — METRONIDAZOLE IVPB CUSTOM: 2000.0000 mg | Freq: Once | INTRAVENOUS | Status: DC

## 2018-04-14 MED ORDER — DOXYCYCLINE HYCLATE 100 MG PO TABS
100.0000 mg | ORAL_TABLET | Freq: Once | ORAL | Status: AC
  Administered 2018-04-14: 100 mg via ORAL
  Filled 2018-04-14: qty 1

## 2018-04-14 MED ORDER — AZITHROMYCIN 500 MG PO TABS
1000.0000 mg | ORAL_TABLET | Freq: Once | ORAL | Status: AC
  Administered 2018-04-14: 1000 mg via ORAL
  Filled 2018-04-14: qty 2

## 2018-04-14 MED ORDER — METRONIDAZOLE 500 MG PO TABS
2000.0000 mg | ORAL_TABLET | Freq: Once | ORAL | Status: AC
  Administered 2018-04-14: 2000 mg via ORAL
  Filled 2018-04-14: qty 4

## 2018-04-14 MED ORDER — DOXYCYCLINE HYCLATE 100 MG PO CAPS: 100.0000 mg | ORAL_CAPSULE | Freq: Two times a day (BID) | ORAL | 0 refills | Status: AC

## 2018-04-14 NOTE — ED Notes (Signed)
See triage note.  Presents with vaginal discharge for about 2 weeks   Denies any urinary sx'     But feels funny "down there"

## 2018-04-14 NOTE — ED Provider Notes (Signed)
[facility] Emergency Department Provider Note  ____________________________________________  Time seen: Approximately 6:58 PM  I have reviewed the triage vital signs and the nursing notes.   HISTORY  Chief Complaint Vaginal Discharge    HPI Kristie Santana is a 42 y.o. female presents to the emergency department with increased vaginal discharge and pelvic pain for approximately 2 weeks.  Patient reports that vaginal discharge is white and brown in color.  Patient reports that her significant other who she has been with for the past year recently cheated on her.  She denies dysuria, increased urinary frequency or hematuria.  No back pain.  Patient denies fever and chills.  No nausea or vomiting.  No alleviating measures have been attempted.   Past Medical History:  Diagnosis Date  . Achilles tendinitis of right lower extremity   . Allergy   . Asthma   . Bilateral chronic knee pain   . Bilateral sacroiliitis (HCC)   . COPD (chronic obstructive pulmonary disease) (HCC)   . Diabetes mellitus without complication (HCC)   . Dysmetabolic syndrome   . History of abnormal cervical Pap smear   . Hypertension   . Left cervical lymphadenopathy     Patient Active Problem List   Diagnosis Date Noted  . Hypokalemia 10/11/2016  . Knee pain, bilateral 11/21/2015  . OA (osteoarthritis) of knee 11/21/2015  . Back pain at L4-L5 level 07/07/2015  . DDD (degenerative disc disease), lumbar 07/07/2015  . Lumbar radiculopathy 07/07/2015  . Streptococcal pharyngitis 03/20/2015  . Achilles tendinitis 02/22/2015  . CAFL (chronic airflow limitation) (HCC) 02/22/2015  . Benign hypertension 02/22/2015  . Chronic pain 02/22/2015  . Inflammation of sacroiliac joint (HCC) 02/22/2015  . 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 Dyslipidemia 02/22/2015  . Dysmetabolic syndrome 02/22/2015  . H/O abnormal cervical Papanicolaou smear  02/22/2015  . H/O renal calculi 02/22/2015  . History of methicillin resistant Staphylococcus aureus infection 02/22/2015  . Allergic rhinitis 02/22/2015  . Compulsive tobacco user syndrome 02/22/2015  . 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 Obesity (BMI 30-39.9) 02/22/2015  . Gastro-esophageal reflux disease without esophagitis 09/30/2007    Past Surgical History:  Procedure Laterality Date  . CHOLECYSTECTOMY    . DILATION AND CURETTAGE OF UTERUS    . LEEP    . TUBAL LIGATION      Prior to Admission medications   Medication Sig Start Date End Date Taking? Authorizing Provider  aspirin EC 81 MG tablet Take 1 tablet (81 mg total) by mouth daily. 12/19/15   Alba Cory, MD  beclomethasone (QVAR) 80 MCG/ACT inhaler Inhale 2 puffs into the lungs 2 (two) times daily.    [provider]  CARTIA XT 180 MG 24 hr capsule Take 180 mg by mouth daily.    [provider]  doxycycline (VIBRAMYCIN) 100 MG capsule Take 1 capsule (100 mg total) by mouth 2 (two) times daily for 7 days. 04/14/18 04/21/18  Orvil Feil, PA-C  loratadine (CLARITIN) 10 MG tablet Take 1 tablet (10 mg total) by mouth daily. 08/31/15   Alba Cory, MD  metFORMIN (GLUCOPHAGE) 850 MG tablet Take 1 tablet (850 mg total) by mouth daily. 12/19/15   Alba Cory, MD  omeprazole (PRILOSEC) 20 MG capsule Take 1 capsule (20 mg total) by mouth 2 (two) times daily. 12/19/15   Alba Cory, MD  PROAIR HFA 108 [phone] Base) MCG/ACT inhaler inhale 2 puffs by mouth every 6 hours if needed for wheezing  or shortness of breath 06/21/16   Alba CorySowles, Krichna, MD    Allergies Ace inhibitors; Fish allergy; Latex; Penicillins; and Sulfa antibiotics  Family History  Problem Relation Age of Onset  . Hypertension Mother   . Hypertension Sister   . Diabetes Sister     Social History Social History   Tobacco Use  . Smoking status: Current Every Day Smoker    Packs/day: 1.00    Years: 24.00    Pack years: 24.00     Types: Cigarettes    Start date: 02/23/1991  . Smokeless tobacco: Never Used  Substance Use Topics  . Alcohol use: Yes    Alcohol/week: 0.0 standard drinks    Comment: occasionally  . Drug use: No     Review of Systems  Constitutional: No fever/chills Eyes: No visual changes. No discharge ENT: No upper respiratory complaints. Cardiovascular: no chest pain. Respiratory: no cough. No SOB. Gastrointestinal: Patient has pelvic pain.  No nausea, no vomiting.  No diarrhea.  No constipation. Genitourinary: Negative for dysuria. No hematuria. Patient has increased vaginal discharge.  Musculoskeletal: Negative for musculoskeletal pain. Skin: Negative for rash, abrasions, lacerations, ecchymosis. Neurological: Negative for headaches, focal weakness or numbness.   ____________________________________________   PHYSICAL EXAM:  VITAL SIGNS: ED Triage Vitals  Enc Vitals Group     BP 04/14/18 1749 116/70     Pulse Rate 04/14/18 1749 (!) 105     Resp 04/14/18 1749 20     Temp 04/14/18 1749 97.8 F (36.6 C)     Temp Source 04/14/18 1749 Oral     SpO2 04/14/18 1749 98 %     Weight 04/14/18 1749 210 lb (95.3 kg)     Height 04/14/18 1749 5\' 5"  (1.651 m)     Head Circumference --      Peak Flow --      Pain Score 04/14/18 1747 0     Pain Loc --      Pain Edu? --      Excl. in GC? --      Constitutional: Alert and oriented. Well appearing and in no acute distress. Eyes: Conjunctivae are normal. PERRL. EOMI. Head: Atraumatic. Cardiovascular: Normal rate, regular rhythm. Normal S1 and S2.  Good peripheral circulation. Respiratory: Normal respiratory effort without tachypnea or retractions. Lungs CTAB. Good air entry to the bases with no decreased or absent breath sounds. Gastrointestinal: Bowel sounds 4 quadrants.  Tenderness is elicited with palpation of the right and left lower quadrants.  No guarding or rigidity. No palpable masses. No distention. No CVA tenderness. Genitourinary:  No cervical motion tenderness was elicited.  Musculoskeletal: Full range of motion to all extremities. No gross deformities appreciated. Neurologic:  Normal speech and language. No gross focal neurologic deficits are appreciated.  Skin:  Skin is warm, dry and intact. No rash noted.  ____________________________________________   LABS (all labs ordered are listed, but only abnormal results are displayed)  Labs Reviewed  WET PREP, GENITAL - Abnormal; Notable for the following components:      Result Value   Clue Cells Wet Prep HPF POC PRESENT (*)    All other components within normal limits  URINALYSIS, COMPLETE (UACMP) WITH MICROSCOPIC - Abnormal; Notable for the following components:   Color, Urine STRAW (*)    APPearance HAZY (*)    All other components within normal limits  CBC WITH DIFFERENTIAL/PLATELET - Abnormal; Notable for the following components:   Hemoglobin 11.7 (*)    HCT 34.6 (*)  MCV 79.6 (*)    RDW 16.1 (*)    All other components within normal limits  COMPREHENSIVE METABOLIC PANEL - Abnormal; Notable for the following components:   Potassium 3.1 (*)    Glucose, Bld 103 (*)    All other components within normal limits  CHLAMYDIA/NGC RT PCR ([facility] ONLY)  POCT PREGNANCY, URINE   ____________________________________________  EKG   ____________________________________________  RADIOLOGY   US Pelvic Complete W Transvaginal And Torsion R/o  Result Date: 04/14/2018 CLINICAL DATA:  Vaginal discharge x1 day EXAM: TRANSABDOMINAL AND TRANSVAGINAL ULTRASOUND OF PELVIS DOPPLER ULTRASOUND OF OVARIES TECHNIQUE: Both transabdominal and transvaginal ultrasound examinations of the pelvis were performed. Transabdominal technique was performed for global imaging of the pelvis including uterus, ovaries, adnexal regions, and pelvic cul-de-sac. It was necessary to proceed with endovaginal exam following the transabdominal exam to visualize the endometrium and both  ovaries. Color and duplex Doppler ultrasound was utilized to evaluate blood flow to the ovaries. COMPARISON:  None. FINDINGS: Uterus Measurements: 8.3 x 5.6 x 5.9 cm. No fibroids or other mass visualized. Endometrium Thickness: 18 mm.  No focal abnormality visualized. Right ovary Measurements: 3.2 x 1.7 x 2 cm.  Normal appearance/no adnexal mass. Left ovary Measurements: 3.8 x 1.9 x 2.2 cm. Normal appearance/no adnexal mass. Small amount of free fluid in the left adnexa with what appears to be an involuting follicle/corpus luteum on left. Pulsed Doppler evaluation of both ovaries demonstrates normal low-resistance arterial and venous waveforms. Other findings None IMPRESSION: 1. Involuting left ovarian follicle/corpus luteum with adjacent trace free fluid in the left adnexa. 2. No uterine mass. Endometrial stripe is approximately 18 mm which may correspond with phase of menstrual cycle. Electronically Signed   By: Tollie Eth M.D.   On: 04/14/2018 19:24    ____________________________________________    PROCEDURES  Procedure(s) performed:    Procedures    Medications  azithromycin (ZITHROMAX) tablet 1,000 mg (1,000 mg Oral Given 04/14/18 2033)  doxycycline (VIBRA-TABS) tablet 100 mg (100 mg Oral Given 04/14/18 2033)  metroNIDAZOLE (FLAGYL) tablet 2,000 mg (2,000 mg Oral Given 04/14/18 2033)     ____________________________________________   INITIAL IMPRESSION / ASSESSMENT AND PLAN / ED COURSE  Pertinent labs & imaging results that were available during my care of the patient were reviewed by me and considered in my medical decision making (see chart for details).  Review of the [place] CSRS was performed in accordance of the [facility] prior to dispensing any controlled drugs.      Assessment and Plan:  Pelvic Pain STD exposure Patient presents to the emergency department with pelvic pain in addition to increased vaginal discharge.  Differential diagnosis originally included PID, gonorrhea,  chlamydia, trichomoniasis, yeast vaginitis and BV.  Patient was treated empirically for chlamydia with azithromycin and BV and trichomoniasis with Flagyl.  She was discharged with doxycycline.  She was advised to follow-up with local health department for full STD panel.  All patient questions were answered.    ____________________________________________  FINAL CLINICAL IMPRESSION(S) / ED DIAGNOSES  Final diagnoses:  Pelvic pain      NEW MEDICATIONS STARTED DURING THIS VISIT:  ED Discharge Orders         Ordered    doxycycline (VIBRAMYCIN) 100 MG capsule  2 times daily     04/14/18 2017              This chart was dictated using voice recognition software/Dragon. Despite best efforts to proofread, errors can occur which can change the meaning.  Any change was purely unintentional.    Gasper Lloyd 04/14/18 2123    Sharman Cheek, MD 04/19/18 [phone]

## 2018-04-14 NOTE — ED Triage Notes (Signed)
White and brown vaginal discharge X 2 weeks. Pt states "it feels funny down there". Pt alert and oriented X4, active, cooperative, pt in NAD. RR even and unlabored, color WNL.

## 2018-04-14 NOTE — ED Notes (Signed)
Pt refused discharge vitals 

## 2018-09-18 ENCOUNTER — Ambulatory Visit
Admission: RE | Admit: 2018-09-18 | Discharge: 2018-09-18 | Disposition: A | Discharge status: Home / Self Care | Payer: Disability Insurance | Attending: Pediatrics | Admitting: Pediatrics

## 2018-09-18 ENCOUNTER — Ambulatory Visit
Admission: RE | Admit: 2018-09-18 | Discharge: 2018-09-18 | Disposition: A | Discharge status: Home / Self Care | Payer: Disability Insurance | Source: Ambulatory Visit | Attending: Pediatrics | Admitting: Pediatrics

## 2018-09-18 ENCOUNTER — Other Ambulatory Visit: Payer: Self-pay | Admitting: Pediatrics

## 2018-09-18 DIAGNOSIS — M5126 Other intervertebral disc displacement, lumbar region: Secondary | ICD-10-CM

## 2019-08-02 ENCOUNTER — Encounter: Payer: Self-pay | Admitting: Emergency Medicine

## 2019-08-02 ENCOUNTER — Emergency Department
Admission: EM | Admit: 2019-08-02 | Discharge: 2019-08-02 | Disposition: A | Discharge status: Home / Self Care | Payer: Self-pay | Attending: Emergency Medicine | Admitting: Emergency Medicine

## 2019-08-02 ENCOUNTER — Other Ambulatory Visit: Payer: Self-pay

## 2019-08-02 ENCOUNTER — Emergency Department: Payer: Self-pay

## 2019-08-02 DIAGNOSIS — Z7984 Long term (current) use of oral hypoglycemic drugs: Secondary | ICD-10-CM | POA: Insufficient documentation

## 2019-08-02 DIAGNOSIS — I10 Essential (primary) hypertension: Secondary | ICD-10-CM | POA: Insufficient documentation

## 2019-08-02 DIAGNOSIS — F17210 Nicotine dependence, cigarettes, uncomplicated: Secondary | ICD-10-CM | POA: Insufficient documentation

## 2019-08-02 DIAGNOSIS — Y939 Activity, unspecified: Secondary | ICD-10-CM | POA: Insufficient documentation

## 2019-08-02 DIAGNOSIS — J45909 Unspecified asthma, uncomplicated: Secondary | ICD-10-CM | POA: Insufficient documentation

## 2019-08-02 DIAGNOSIS — J449 Chronic obstructive pulmonary disease, unspecified: Secondary | ICD-10-CM | POA: Insufficient documentation

## 2019-08-02 DIAGNOSIS — E119 Type 2 diabetes mellitus without complications: Secondary | ICD-10-CM | POA: Insufficient documentation

## 2019-08-02 DIAGNOSIS — G8929 Other chronic pain: Secondary | ICD-10-CM | POA: Insufficient documentation

## 2019-08-02 DIAGNOSIS — Z79899 Other long term (current) drug therapy: Secondary | ICD-10-CM | POA: Insufficient documentation

## 2019-08-02 DIAGNOSIS — Z7982 Long term (current) use of aspirin: Secondary | ICD-10-CM | POA: Insufficient documentation

## 2019-08-02 DIAGNOSIS — M7022 Olecranon bursitis, left elbow: Secondary | ICD-10-CM | POA: Insufficient documentation

## 2019-08-02 DIAGNOSIS — M25562 Pain in left knee: Secondary | ICD-10-CM | POA: Insufficient documentation

## 2019-08-02 MED ORDER — MELOXICAM 15 MG PO TABS: 15.0000 mg | ORAL_TABLET | Freq: Every day | ORAL | 0 refills | Status: AC

## 2019-08-02 MED ORDER — LIDOCAINE 5 % EX PTCH: 1.0000 | MEDICATED_PATCH | Freq: Two times a day (BID) | CUTANEOUS | 0 refills | Status: AC

## 2019-08-02 MED ORDER — LIDOCAINE 5 % EX PTCH
1.0000 | MEDICATED_PATCH | CUTANEOUS | Status: DC
  Administered 2019-08-02: 1 via TRANSDERMAL
  Filled 2019-08-02: qty 1

## 2019-08-02 NOTE — ED Notes (Addendum)
See triage note  Presents with chronic knee s/p fall about 2 months ago  Also states she hit her left  elbow

## 2019-08-02 NOTE — ED Provider Notes (Signed)
[facility] Emergency Department Provider Note  ____________________________________________   First MD Initiated Contact with Patient 08/02/19 [phone]     (approximate)  I have reviewed the triage vital signs and the nursing notes.   HISTORY  Chief Complaint Knee Pain and Arm Injury   HPI Kristie Santana is a 43 y.o. female presents to the ED with complaint of chronic knee pain and left elbow pain.  She states she fell 2 months ago and hit her left knee and left elbow.  She has not been evaluated for this injury and continues to have pain in her left knee.  Patient states she has taken over-the-counter medication without any relief of her pain.  She continues to ambulate without any assistance.  She also in the past has been seen at pain clinics for her chronic knee pain bilaterally and other chronic pain issues with her back.  Currently she is not being seen at the pain clinic.  Rates her pain as an 8 out of 10.     Past Medical History:  Diagnosis Date  . Achilles tendinitis of right lower extremity   . Allergy   . Asthma   . Bilateral chronic knee pain   . Bilateral sacroiliitis (HCC)   . COPD (chronic obstructive pulmonary disease) (HCC)   . Diabetes mellitus without complication (HCC)   . Dysmetabolic syndrome   . History of abnormal cervical Pap smear   . Hypertension   . Left cervical lymphadenopathy     Patient Active Problem List   Diagnosis Date Noted  . Hypokalemia 10/11/2016  . Knee pain, bilateral 11/21/2015  . OA (osteoarthritis) of knee 11/21/2015  . Back pain at L4-L5 level 07/07/2015  . DDD (degenerative disc disease), lumbar 07/07/2015  . Lumbar radiculopathy 07/07/2015  . Streptococcal pharyngitis 03/20/2015  . Achilles tendinitis 02/22/2015  . CAFL (chronic airflow limitation) (HCC) 02/22/2015  . Benign hypertension 02/22/2015  . Chronic pain 02/22/2015  . Inflammation of sacroiliac joint (HCC) 02/22/2015  . Well controlled  type 2 diabetes mellitus with peripheral neuropathy (HCC) 02/22/2015  . Dyslipidemia 02/22/2015  . Dysmetabolic syndrome 02/22/2015  . H/O abnormal cervical Papanicolaou smear 02/22/2015  . H/O renal calculi 02/22/2015  . History of methicillin resistant Staphylococcus aureus infection 02/22/2015  . Allergic rhinitis 02/22/2015  . Compulsive tobacco user syndrome 02/22/2015  . 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 Obesity (BMI 30-39.9) 02/22/2015  . Gastro-esophageal reflux disease without esophagitis 09/30/2007    Past Surgical History:  Procedure Laterality Date  . CHOLECYSTECTOMY    . DILATION AND CURETTAGE OF UTERUS    . LEEP    . TUBAL LIGATION      Prior to Admission medications   Medication Sig Start Date End Date Taking? Authorizing Provider  aspirin EC 81 MG tablet Take 1 tablet (81 mg total) by mouth daily. 12/19/15   Alba Cory, MD  beclomethasone (QVAR) 80 MCG/ACT inhaler Inhale 2 puffs into the lungs 2 (two) times daily.    [provider]  CARTIA XT 180 MG 24 hr capsule Take 180 mg by mouth daily.    [provider]  lidocaine (LIDODERM) 5 % Place 1 patch onto the skin every 12 (twelve) hours. Remove & Discard patch within 12 hours or as directed by MD 08/02/19 08/01/20  Bridget Hartshorn L, PA-C  loratadine (CLARITIN) 10 MG tablet Take 1 tablet (10 mg total) by mouth daily. 08/31/15   Alba Cory, MD  meloxicam (MOBIC) 15 MG tablet Take 1  tablet (15 mg total) by mouth daily. 08/02/19 08/01/20  Tommi Rumps, PA-C  metFORMIN (GLUCOPHAGE) 850 MG tablet Take 1 tablet (850 mg total) by mouth daily. 12/19/15   Alba Cory, MD  omeprazole (PRILOSEC) 20 MG capsule Take 1 capsule (20 mg total) by mouth 2 (two) times daily. 12/19/15   Alba Cory, MD  PROAIR HFA 108 [phone] Base) MCG/ACT inhaler inhale 2 puffs by mouth every 6 hours if needed for wheezing or shortness of breath 06/21/16   Alba Cory, MD    Allergies Ace inhibitors, Fish allergy,  Latex, Other, Penicillins, and Sulfa antibiotics  Family History  Problem Relation Age of Onset  . Hypertension Mother   . Hypertension Sister   . Diabetes Sister     Social History Social History   Tobacco Use  . Smoking status: Current Every Day Smoker    Packs/day: 1.00    Years: 24.00    Pack years: 24.00    Types: Cigarettes    Start date: 02/23/1991  . Smokeless tobacco: Never Used  Substance Use Topics  . Alcohol use: Yes    Alcohol/week: 0.0 standard drinks    Comment: occasionally  . Drug use: No    Review of Systems Constitutional: No fever/chills Eyes: No visual changes. Cardiovascular: Denies chest pain. Respiratory: Denies shortness of breath. Gastrointestinal: No abdominal pain.  No nausea, no vomiting. Genitourinary: Negative for dysuria. Musculoskeletal: Positive for chronic bilateral knee pain.  Positive for left knee pain secondary to fall and left elbow pain. Skin: Negative for rash. Neurological: Negative for headaches, focal weakness or numbness. ____________________________________________   PHYSICAL EXAM:  VITAL SIGNS: ED Triage Vitals  Enc Vitals Group     BP --      Pulse Rate 08/02/19 0916 78     Resp 08/02/19 0916 20     Temp 08/02/19 0916 98.8 F (37.1 C)     Temp Source 08/02/19 0916 Oral     SpO2 08/02/19 0916 100 %     Weight 08/02/19 0913 227 lb (103 kg)     Height 08/02/19 0913 5\' 5"  (1.651 m)     Head Circumference --      Peak Flow --      Pain Score 08/02/19 0913 8     Pain Loc --      Pain Edu? --      Excl. in GC? --    Constitutional: Alert and oriented. Well appearing and in no acute distress.  Patient sitting on the edge of the stretcher and does not appear to be any acute distress.  Patient is ambulatory without any assistance. Eyes: Conjunctivae are normal.  Head: Atraumatic. Neck: No stridor.   Cardiovascular: Normal rate, regular rhythm. Grossly normal heart sounds.  Good peripheral circulation. Respiratory:  Normal respiratory effort.  No retractions. Lungs CTAB. Gastrointestinal: Soft and nontender. No distention.  Musculoskeletal: On examination of the left elbow there is some soft tissue edema posterior elbow at the olecranon.  No warmth or redness is noted.  Minimal fluid.  Range of motion is without restriction.  Examination of the left knee there is no gross deformity however there is generalized tenderness on palpation anteriorly.  Range of motion is restricted secondary to patient's pain.  No skin discoloration or effusion is present. Neurologic:  Normal speech and language. No gross focal neurologic deficits are appreciated. No gait instability. Skin:  Skin is warm, dry and intact. No rash noted. Psychiatric: Mood and affect are normal. Speech and  behavior are normal.  ____________________________________________   LABS (all labs ordered are listed, but only abnormal results are displayed)  Labs Reviewed - No data to display  RADIOLOGY  Official radiology report(s): DG Elbow Complete Left  Result Date: 08/02/2019 CLINICAL DATA:  Pain and fall EXAM: LEFT ELBOW - COMPLETE 3+ VIEW COMPARISON:  None. FINDINGS: There is no evidence of fracture, dislocation, or joint effusion. Posterior olecranon process enthesophyte is seen. Posterior elbow soft tissue swelling is seen. There is small enthesophyte seen at the ulnar trochlear joint. IMPRESSION: Posterior elbow soft tissue swelling which could represent olecranon bursitis. No acute osseous abnormality. Electronically Signed   By: Jonna ClarkBindu  Avutu M.D.   On: 08/02/2019 10:10   CT Knee Left Wo Contrast  Result Date: 08/02/2019 CLINICAL DATA:  Chronic knee pain progressing since falling 2 months ago. EXAM: CT OF THE LEFT KNEE WITHOUT CONTRAST TECHNIQUE: Multidetector CT imaging of the left knee was performed according to the standard protocol. Multiplanar CT image reconstructions were also generated. COMPARISON:  Radiographs same date. FINDINGS:  Bones/Joint/Cartilage The radiographic finding involving the proximal tibia appears to reflect a fragmented osteophyte of the central tibia posteriorly. This is well corticated with sclerotic margins. No evidence of acute fracture or dislocation. There is also fragmented spurring of the patella superiorly and posteriorly. No significant joint effusion. Ligaments Suboptimally assessed by CT. The cruciate ligaments appear intact. Muscles and Tendons As above, fragmented enthesophytes at the patellar attachments of the quadriceps and patellar tendons. The extensor mechanism is intact. No muscular abnormalities are seen. Soft tissues Unremarkable. IMPRESSION: 1. The radiographic finding appears to reflect a fragmented osteophyte of the central tibia posteriorly. 2. No evidence of acute fracture or dislocation. 3. Fragmented enthesophytes of the quadriceps and patellar tendons. Electronically Signed   By: Carey BullocksWilliam  Veazey M.D.   On: 08/02/2019 11:11   DG Knee Complete 4 Views Left  Result Date: 08/02/2019 CLINICAL DATA:  Pain fall injury 2 months ago EXAM: LEFT KNEE - COMPLETE 4+ VIEW COMPARISON:  None. FINDINGS: There is a fragmented enthesophyte seen at the inferior patellar pole. Well corticated margins are seen, this is likely chronic. There is also a large superior patellar pole enthesophyte. There is a small linear lucency seen at the posterior aspect of the proximal tibia best seen on lateral views which could represent a nondisplaced fracture. No other fractures identified. A small knee joint effusion is seen. IMPRESSION: 1. Linear lucency at the proximal posterior tibia which could represent a nondisplaced fracture. 2. Fragmented inferior patellar pole enthesophyte, likely chronic. Electronically Signed   By: Jonna ClarkBindu  Avutu M.D.   On: 08/02/2019 10:09    ____________________________________________   PROCEDURES  Procedure(s) performed (including Critical  Care):  Procedures   ____________________________________________   INITIAL IMPRESSION / ASSESSMENT AND PLAN / ED COURSE  As part of my medical decision making, I reviewed the following data within the electronic MEDICAL RECORD NUMBER Notes from prior ED visits and [place] Controlled Substance Database  43 year old female with history of chronic knee pain, back pain and degenerative changes presents today with complaint of left elbow and knee pain following a fall that occurred 2 months ago.  Patient states this is the first evaluation since the time of her fall.  She states she has taken over-the-counter medication without any relief.  X-rays were negative for any acute bony injury.  Initially on plain films it was question whether she had a posterior tibial fracture but on CT scan this was determined to be osteophyte  and degenerative changes.  Patient was given a Lidoderm patch while in the ED and a prescription for meloxicam 15 mg 1 daily.  She is encouraged to follow-up with her PCP and if any continued problems with her knee or elbow she could follow-up with Dr. Mack Guise who is the orthopedist on call.  ____________________________________________   FINAL CLINICAL IMPRESSION(S) / ED DIAGNOSES  Final diagnoses:  Acute pain of left knee  Chronic pain of both knees  Olecranon bursitis of left elbow     ED Discharge Orders         Ordered    meloxicam (MOBIC) 15 MG tablet  Daily     08/02/19 1201    lidocaine (LIDODERM) 5 %  Every 12 hours     08/02/19 1201           Note:  This document was prepared using Dragon voice recognition software and may include unintentional dictation errors.    Johnn Hai, PA-C 08/02/19 1518    Nena Polio, MD 08/02/19 [phone]

## 2019-08-02 NOTE — ED Triage Notes (Signed)
Pt is refusing to have her BP taken. Pt states cannot have BP taken without anxiety medication. Pt also reports allergic to hospital ID bracelets. Wide white tape placed on pts wrist so the ID bracelet could be placed over that to ensure proper ID of pt.

## 2019-08-02 NOTE — ED Triage Notes (Signed)
Pt reports has chronic knee pain and she fell about 2 months ago and the pain is worse. Pt also c/o pain to her right elbow from the fall.

## 2019-08-02 NOTE — Discharge Instructions (Signed)
Follow-up with your primary care provider if any continued problems or concerns.  The lidocaine patch was placed on your knee while in the ED.  A prescription for the same was sent to your pharmacy.  Begin taking meloxicam 15 mg 1 daily with food.  Both of these medications can be prescribed by your primary care provider if it is helping.  Call make a follow-up appointment with your primary care provider for reevaluation.

## 2020-04-13 IMAGING — DX DG KNEE COMPLETE 4+V*L*
4 series · 4 of 4 positions shown · non-contrast
Comparison: None.

CLINICAL DATA: Pain fall injury 2 months ago

EXAM:
LEFT KNEE - COMPLETE 4+ VIEW

[knee ap]
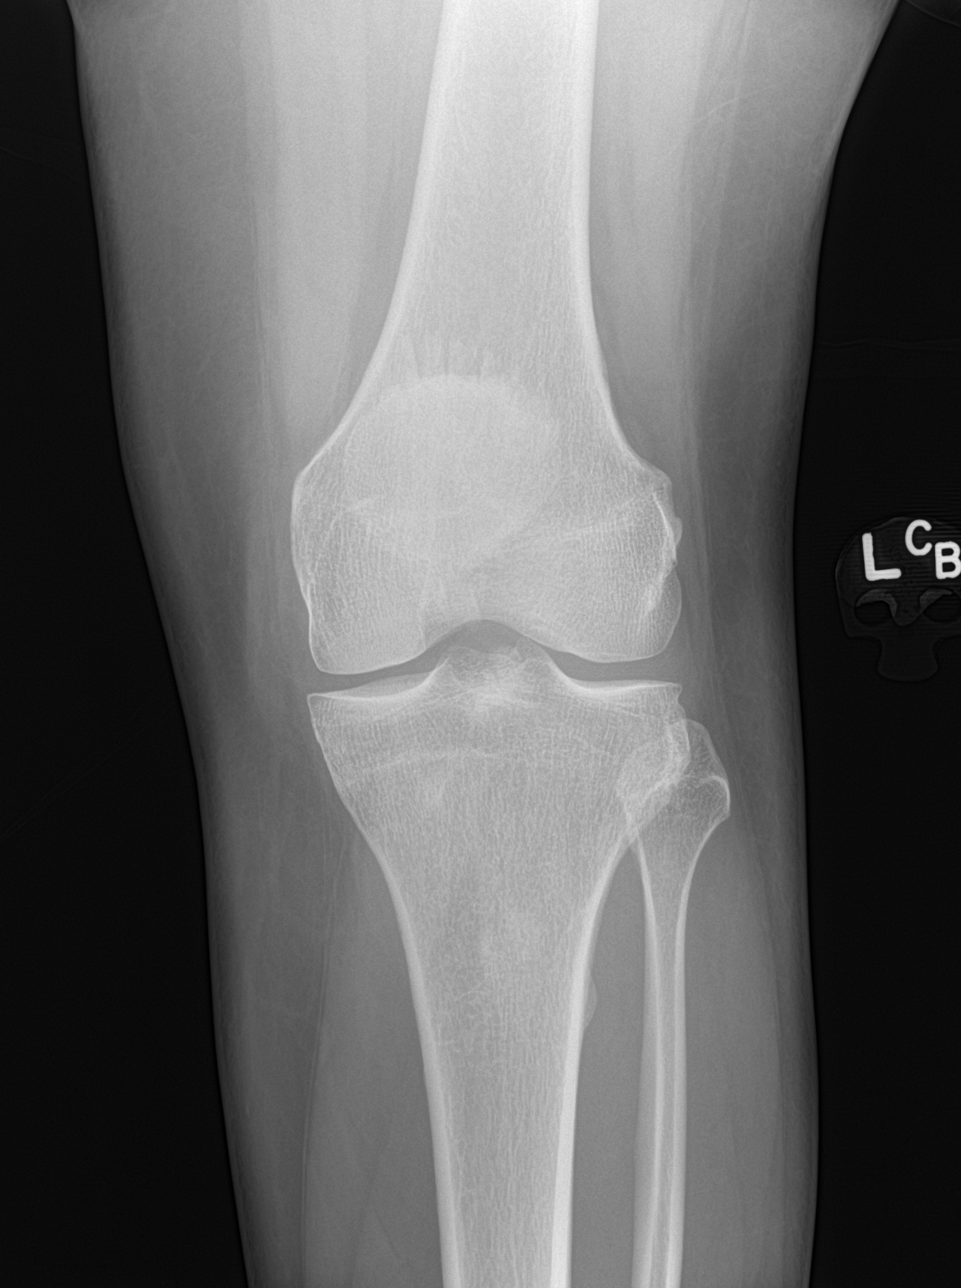

[knee lat]
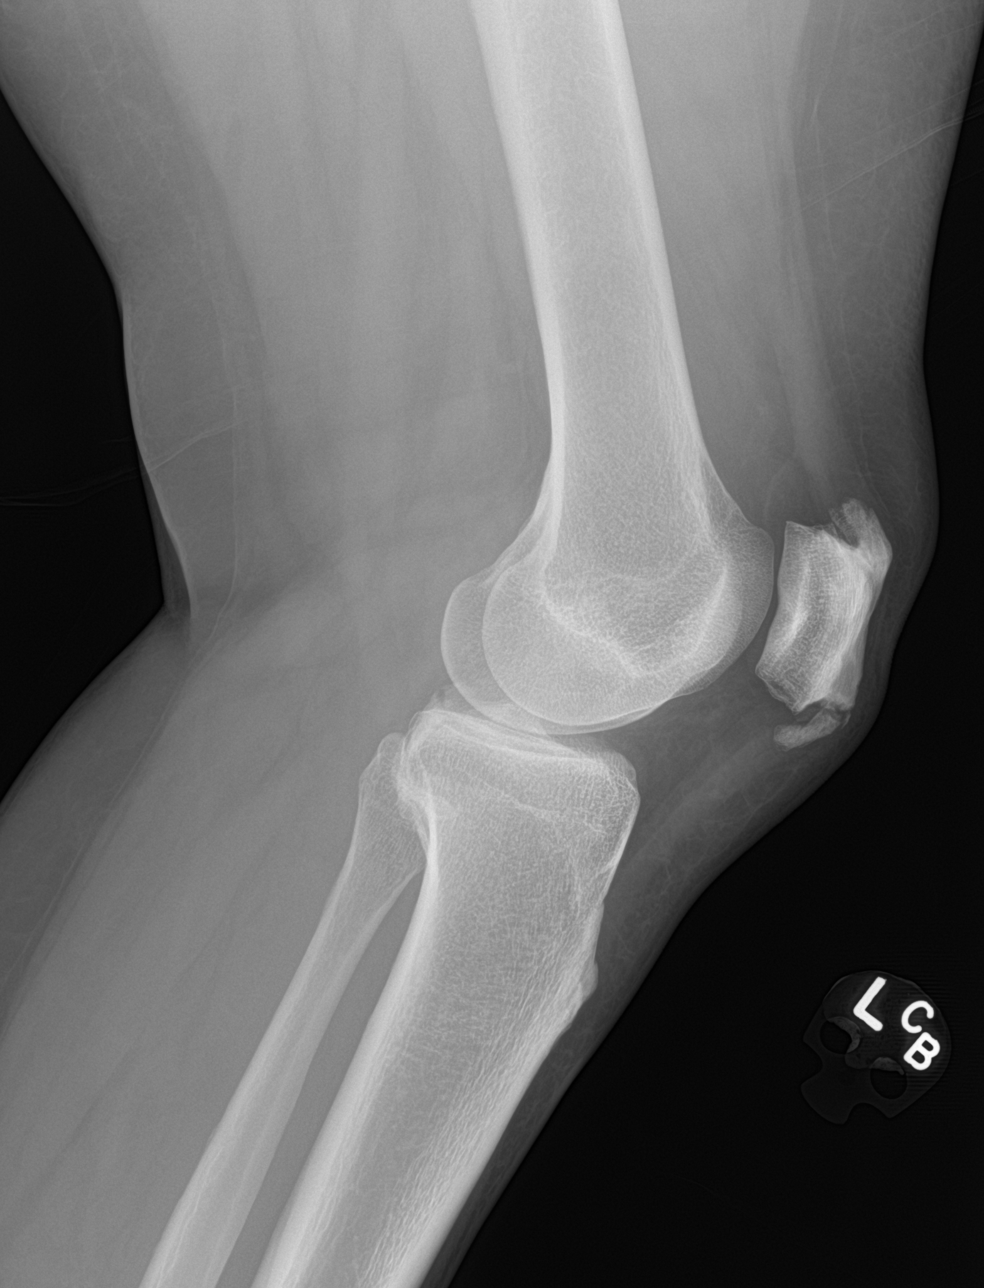

[knee obl (1 of 2)]
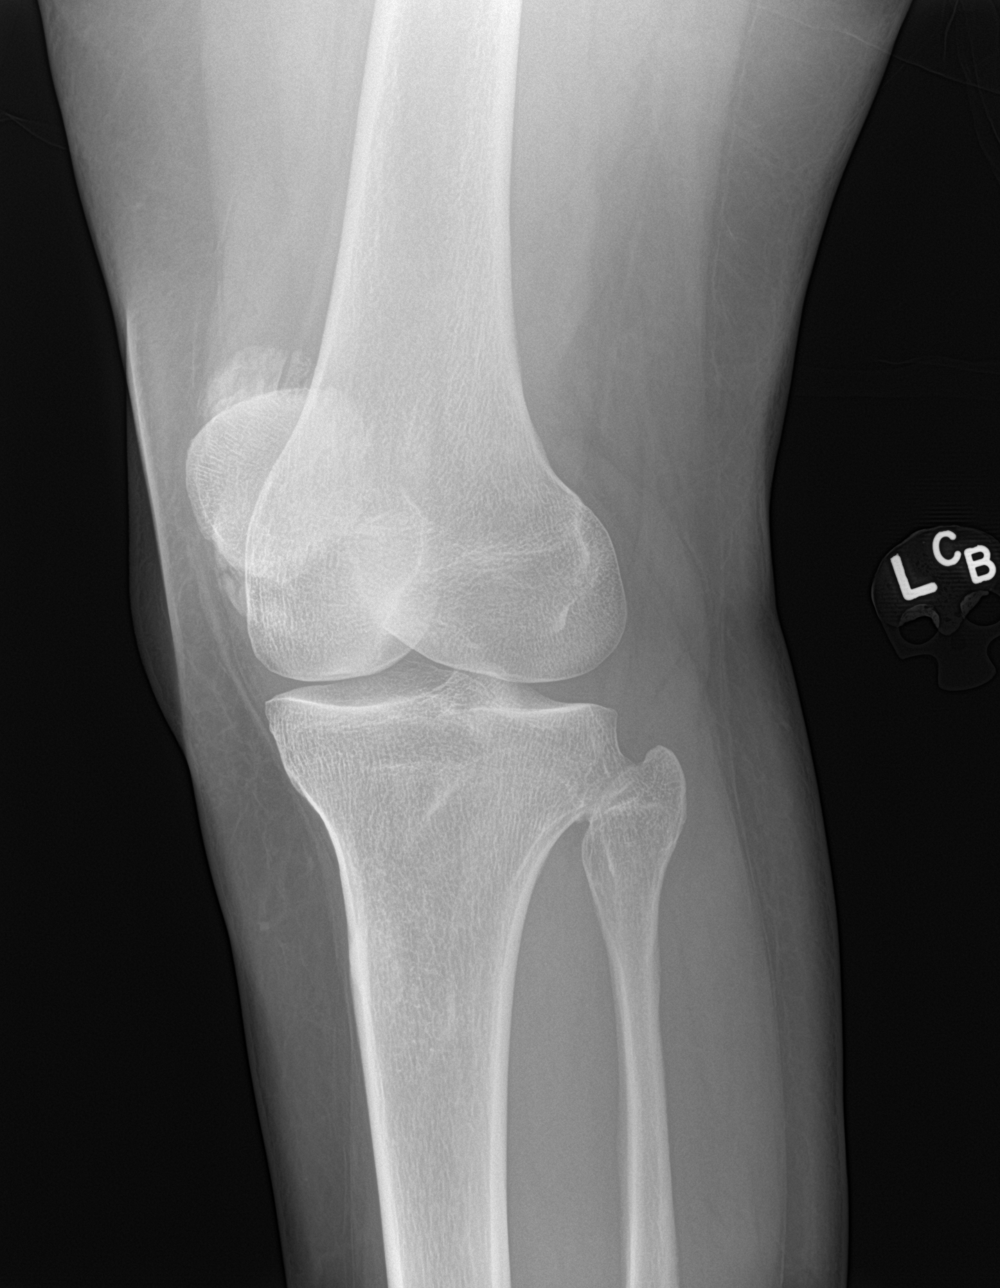

[knee obl (2 of 2)]
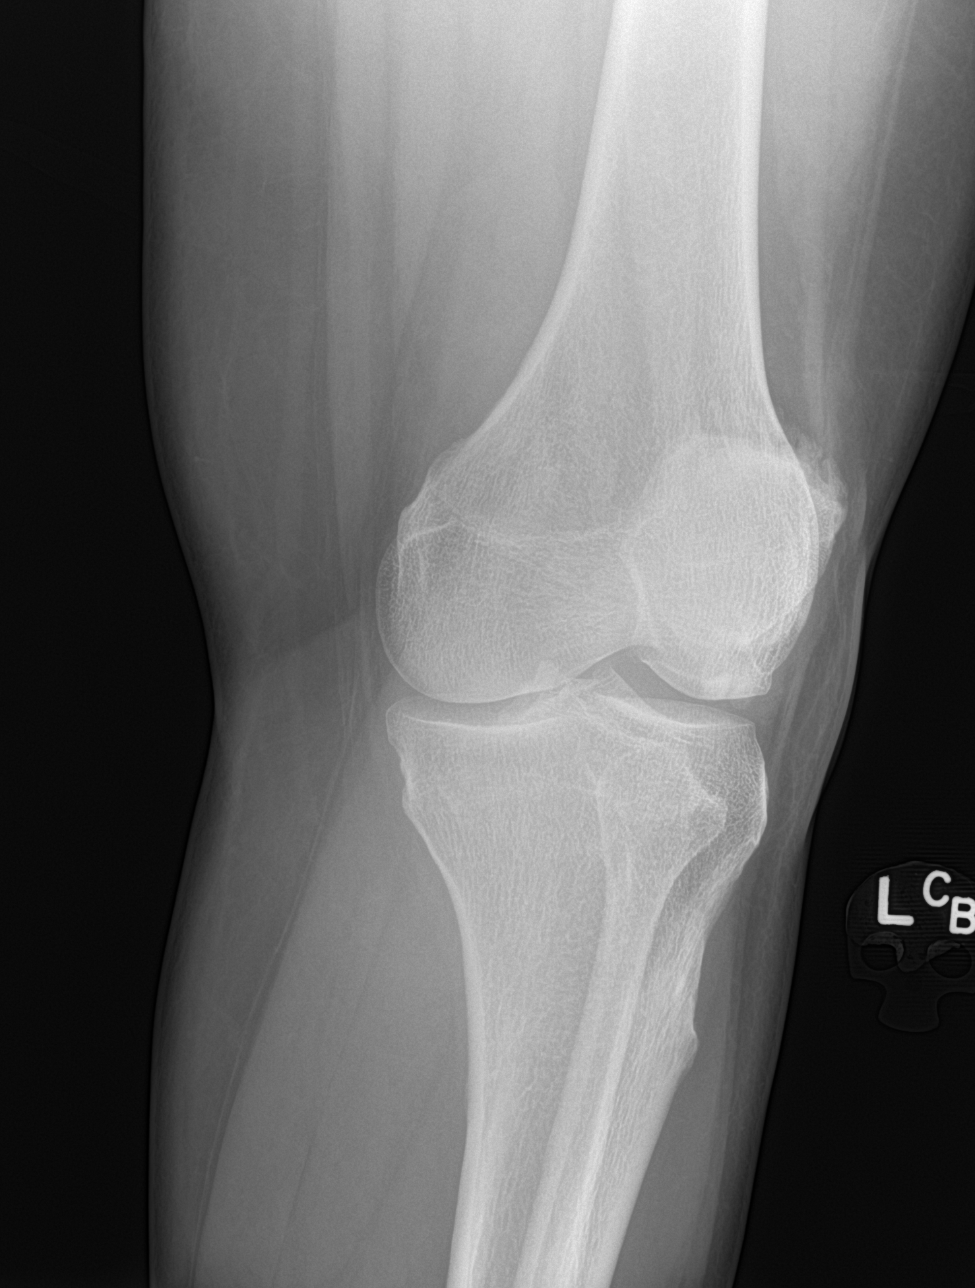

[4 of 4 positions shown; findings below may reference images not displayed]

FINDINGS: There is a fragmented enthesophyte seen at the inferior patellar
pole. Well corticated margins are seen, this is likely chronic.
There is also a large superior patellar pole enthesophyte. There is
a small linear lucency seen at the posterior aspect of the proximal
tibia best seen on lateral views which could represent a
nondisplaced fracture. No other fractures identified. A small knee
joint effusion is seen.
IMPRESSION: 1. Linear lucency at the proximal posterior tibia which could
represent a nondisplaced fracture.
2. Fragmented inferior patellar pole enthesophyte, likely chronic.

## 2022-05-08 ENCOUNTER — Other Ambulatory Visit: Payer: Self-pay

## 2022-05-08 ENCOUNTER — Encounter: Payer: Self-pay | Admitting: Emergency Medicine

## 2022-05-08 ENCOUNTER — Emergency Department
Admission: EM | Admit: 2022-05-08 | Discharge: 2022-05-08 | Disposition: A | Discharge status: Home / Self Care | Payer: Disability Insurance | Attending: Emergency Medicine | Admitting: Emergency Medicine

## 2022-05-08 DIAGNOSIS — B9689 Other specified bacterial agents as the cause of diseases classified elsewhere: Secondary | ICD-10-CM | POA: Insufficient documentation

## 2022-05-08 DIAGNOSIS — B3731 Acute candidiasis of vulva and vagina: Secondary | ICD-10-CM | POA: Insufficient documentation

## 2022-05-08 DIAGNOSIS — N760 Acute vaginitis: Secondary | ICD-10-CM | POA: Insufficient documentation

## 2022-05-08 LAB — WET PREP, GENITAL
Sperm: NONE SEEN
Trich, Wet Prep: NONE SEEN
WBC, Wet Prep HPF POC: <10 (ref <10)

## 2022-05-08 LAB — CHLAMYDIA/NGC RT PCR (ARMC ONLY)
Chlamydia Tr: NOT DETECTED
N gonorrhoeae: NOT DETECTED

## 2022-05-08 LAB — URINALYSIS, ROUTINE W REFLEX MICROSCOPIC
Bilirubin Urine: NEGATIVE
Glucose, UA: NEGATIVE mg/dL
Hgb urine dipstick: NEGATIVE
Ketones, ur: NEGATIVE mg/dL
Leukocytes,Ua: NEGATIVE
Nitrite: NEGATIVE
Protein, ur: 30 mg/dL — AB
Specific Gravity, Urine: 1.028 (ref 1.005–1.030)
pH: 5 (ref 5.0–8.0)

## 2022-05-08 LAB — POC URINE PREG, ED: Preg Test, Ur: NEGATIVE

## 2022-05-08 MED ORDER — METRONIDAZOLE 500 MG PO TABS: 500.0000 mg | ORAL_TABLET | Freq: Two times a day (BID) | ORAL | 0 refills | Status: AC

## 2022-05-08 MED ORDER — FLUCONAZOLE 150 MG PO TABS: 150.0000 mg | ORAL_TABLET | Freq: Once | ORAL | 0 refills | Status: AC

## 2022-05-08 NOTE — ED Notes (Signed)
Pt verbalized understanding of discharge instructions, prescriptions, and follow-up care instructions.  

## 2022-05-08 NOTE — ED Triage Notes (Signed)
Pt via POV from home.Pt c/o vaginal pain, burning sensation when she pees. Denies vaginal bleeding. States she hasn't had sex in 3 months. Pt is A&OX4 and NAD  On arrival, attempted to get patients VS, pt refused to let us get her BP. States that she has a hx of anxiety. Explained to patient that it is a part of plan of care. States that she has to take anxiety medication before getting her BP taken and states that she does not know what medication she is on. Pt allowed [place] to get pulse and temp but states, "that is giving me anxiety already" Explained we possibly give her a dose of her anxiety medication if she knew what it was, pt states "Ill try".

## 2022-05-08 NOTE — ED Triage Notes (Signed)
First Nurse NOte:  Arrives with c/o of a 'woman's problem"  AAOx3.  Skin warm and dyr. NAD

## 2022-05-08 NOTE — ED Notes (Addendum)
Pt refused discharge vital signs. States she has a hx anxiety.

## 2022-05-08 NOTE — ED Notes (Addendum)
Says burning with urinationf or about 3 days.  Right flank pain as well.  No fever or vomiting.  Alert and in nad.  Also says she thinks she has sinus infection--drainage and left ear stopped up.  Says no rash/lesions in peri area.  Says no sex for last 3 months.  Says brownish discharge.  Says she did have an abscess right groin recently.

## 2022-05-08 NOTE — Discharge Instructions (Addendum)
Take the prescription meds as directed.  °

## 2022-05-08 NOTE — ED Provider Notes (Signed)
[facility] Emergency Department Provider Note     Event Date/Time   First MD Initiated Contact with Patient 05/08/22 1306     (approximate)   History   Pelvic Pain   HPI  Kristie Santana is a 46 y.o. female presents to the ED with 3 days of vaginal irritation including burning when she urinates.  She also reports some vaginal discharge patient denies any concern for STI, she has not had intercourse in about 3 months.  She denies any fevers, chills, or sweats.  Patient also denies any abnormal vaginal bleeding.     Physical Exam   Triage Vital Signs: ED Triage Vitals  Enc Vitals Group     BP --      Pulse Rate 05/08/22 1228 84     Resp 05/08/22 1228 (!) 25     Temp 05/08/22 1228 97.7 F (36.5 C)     Temp Source 05/08/22 1228 Oral     SpO2 05/08/22 1228 98 %     Weight 05/08/22 1225 230 lb (104.3 kg)     Height 05/08/22 1225 5\' 5"  (1.651 m)     Head Circumference --      Peak Flow --      Pain Score 05/08/22 1222 8     Pain Loc --      Pain Edu? --      Excl. in [place]? --     Most recent vital signs: Vitals:   05/08/22 1228 05/08/22 1231  Pulse: 84   Resp: (!) 25 18  Temp: 97.7 F (36.5 C)   SpO2: 98%     General Awake, no distress. NAD CV:  Good peripheral perfusion.  RESP:  Normal effort.  ABD:  No distention.  GYN:  Normal external genitalia.  Scant white discharge in the vault.  No CMT or adnexal masses appreciated.   ED Results / Procedures / Treatments   Labs (all labs ordered are listed, but only abnormal results are displayed) Labs Reviewed  WET PREP, GENITAL - Abnormal; Notable for the following components:      Result Value   Yeast Wet Prep HPF POC PRESENT (*)    Clue Cells Wet Prep HPF POC PRESENT (*)    All other components within normal limits  URINALYSIS, ROUTINE W REFLEX MICROSCOPIC - Abnormal; Notable for the following components:   Color, Urine YELLOW (*)    APPearance CLOUDY (*)    Protein, ur 30 (*)     Bacteria, UA MANY (*)    All other components within normal limits  CHLAMYDIA/NGC RT PCR ([facility] ONLY)            POC URINE PREG, ED     EKG   RADIOLOGY   No results found.   PROCEDURES:  Critical Care performed: No  Procedures   MEDICATIONS ORDERED IN ED: Medications - No data to display   IMPRESSION / MDM / [place] / ED COURSE  I reviewed the triage vital signs and the nursing notes.                              Differential diagnosis includes, but is not limited to, yeast vaginitis, BV, GC  Patient's presentation is most consistent with acute complicated illness / injury requiring diagnostic workup.  Patient's diagnosis is consistent with BV and yeast vaginitis.  No laboratory evidence of an acute UTI.  Patient will  be discharged home with prescriptions for Toradol and Diflucan. Patient is to follow up with her primary provider as needed or otherwise directed. Patient is given ED precautions to return to the ED for any worsening or new symptoms.     FINAL CLINICAL IMPRESSION(S) / ED DIAGNOSES   Final diagnoses:  BV (bacterial vaginosis)  Yeast vaginitis     Rx / DC Orders   ED Discharge Orders          Ordered    fluconazole (DIFLUCAN) 150 MG tablet   Once        05/08/22 1517    metroNIDAZOLE (FLAGYL) 500 MG tablet  2 times daily        05/08/22 1517             Note:  This document was prepared using Dragon voice recognition software and may include unintentional dictation errors.    Lissa Hoard, PA-C 05/08/22 1738    Shaune Pollack, MD 05/12/22 [phone]

## 2023-05-28 ENCOUNTER — Other Ambulatory Visit: Payer: Self-pay

## 2023-05-28 ENCOUNTER — Emergency Department
Admission: EM | Admit: 2023-05-28 | Discharge: 2023-05-28 | Disposition: A | Discharge status: Home / Self Care | Payer: 59 | Attending: Emergency Medicine | Admitting: Emergency Medicine

## 2023-05-28 ENCOUNTER — Encounter: Payer: Self-pay | Admitting: Emergency Medicine

## 2023-05-28 DIAGNOSIS — H9203 Otalgia, bilateral: Secondary | ICD-10-CM | POA: Insufficient documentation

## 2023-05-28 DIAGNOSIS — Z1152 Encounter for screening for COVID-19: Secondary | ICD-10-CM | POA: Diagnosis not present

## 2023-05-28 DIAGNOSIS — J019 Acute sinusitis, unspecified: Secondary | ICD-10-CM | POA: Diagnosis not present

## 2023-05-28 LAB — RESP PANEL BY RT-PCR (RSV, FLU A&B, COVID)  RVPGX2
Influenza A by PCR: NEGATIVE
Influenza B by PCR: NEGATIVE
Resp Syncytial Virus by PCR: NEGATIVE
SARS Coronavirus 2 by RT PCR: NEGATIVE

## 2023-05-28 MED ORDER — DOXYCYCLINE HYCLATE 100 MG PO TABS
100.0000 mg | ORAL_TABLET | Freq: Once | ORAL | Status: AC
  Administered 2023-05-28: 100 mg via ORAL
  Filled 2023-05-28: qty 1

## 2023-05-28 MED ORDER — DOXYCYCLINE HYCLATE 100 MG PO CAPS: 100.0000 mg | ORAL_CAPSULE | Freq: Two times a day (BID) | ORAL | 0 refills | Status: AC

## 2023-05-28 NOTE — ED Notes (Addendum)
Pt verbalizes understanding of discharge instructions. Opportunity for questioning and answers were provided. Pt discharged from ED to home with family. Pt refused discharge vitals.    

## 2023-05-28 NOTE — ED Notes (Addendum)
Pt continues to decline blood pressure

## 2023-05-28 NOTE — ED Triage Notes (Signed)
Pt via POV from home. Pt c/o bilateral ear fullness, nasal congestion, and sinus congestion for the past couple of weeks. States she believe its from her allergies.  Refused BP, states she has anxiety and does not want to take a BP because it makes her anxiety worse. Will not allow this RN to take BP.

## 2023-05-28 NOTE — ED Provider Notes (Signed)
[facility] Provider Note    Event Date/Time   First MD Initiated Contact with Patient 05/28/23 2120     (approximate)   History   Otalgia   HPI  Kristie Santana is a 47 y.o. female who presents to the urgency department today with concerns for bilateral ear pain, nasal congestion. The ear pain is associated with a sensation of fullness and decreased hearing. Symptoms have been present for a few weeks. The patient has tried over the counter medication and ear drops without any relief. Says she does have a history of sinusitis.      Physical Exam   Triage Vital Signs: ED Triage Vitals  Encounter Vitals Group     BP --      Systolic BP Percentile --      Diastolic BP Percentile --      Pulse Rate 05/28/23 1813 82     Resp 05/28/23 1813 20     Temp --      Temp src --      SpO2 05/28/23 1813 100 %     Weight 05/28/23 1811 215 lb (97.5 kg)     Height 05/28/23 1811 5\' 5"  (1.651 m)     Head Circumference --      Peak Flow --      Pain Score 05/28/23 1811 0     Pain Loc --      Pain Education --      Exclude from Growth Chart --     Most recent vital signs: Vitals:   05/28/23 1813  Pulse: 82  Resp: 20  SpO2: 100%   General: Awake, alert, oriented. CV:  Good peripheral perfusion.  Resp:  Normal effort.  Abd:  No distention.  Other:  Bilateral TMs without bulging or erythema. External canals with small amount of wax and no other debris.    ED Results / Procedures / Treatments   Labs (all labs ordered are listed, but only abnormal results are displayed) Labs Reviewed  RESP PANEL BY RT-PCR (RSV, FLU A&B, COVID)  [id number]     EKG  None   RADIOLOGY None  PROCEDURES:  Critical Care performed: No   MEDICATIONS ORDERED IN ED: Medications - No data to display   IMPRESSION / MDM / ASSESSMENT AND PLAN / ED COURSE  I reviewed the triage vital signs and the nursing notes.                              Differential diagnosis  includes, but is not limited to, ear infection, sinusitis  Patient's presentation is most consistent with acute presentation with potential threat to life or bodily function.   Patient presented to the emergency department today because of concerns for bilateral ear pain and nasal congestion.  On exam patient's bilateral ears without any significant debris in the external auditory canals.  TMs without bulging or erythema.  At this time I think patient likely suffering from sinus sinusitis.  Given that the symptoms have been ongoing for few weeks do think it be reasonable for patient to be discharged on antibiotics. Will give first does here in the emergency department.      FINAL CLINICAL IMPRESSION(S) / ED DIAGNOSES   Final diagnoses:  Acute sinusitis, recurrence not specified, unspecified location    Note:  This document was prepared using Dragon voice recognition software and may include unintentional dictation errors.  Phineas Semen, MD 05/28/23 2146

## 2023-05-28 NOTE — Discharge Instructions (Signed)
Please seek medical attention for any high fevers, chest pain, shortness of breath, change in behavior, persistent vomiting, bloody stool or any other new or concerning symptoms.  

## 2023-06-27 ENCOUNTER — Telehealth: Payer: Self-pay

## 2023-06-27 NOTE — Telephone Encounter (Signed)
Transition Care Management Unsuccessful Follow-up Telephone Call  Date of discharge and from where:  05/28/2023 [facility]  Attempts:  1st Attempt  Reason for unsuccessful TCM follow-up call:  No answer/busy voicemail did not pickup.  Yoniel Arkwright [facility]  [facility], [facility] Guide Direct Dial: [phone]  Website: [email or web address]

## 2023-06-30 ENCOUNTER — Telehealth: Payer: Self-pay

## 2023-06-30 NOTE — Telephone Encounter (Signed)
Transition Care Management Unsuccessful Follow-up Telephone Call  Date of discharge and from where:  05/28/2023 [facility]  Attempts:  2nd Attempt  Reason for unsuccessful TCM follow-up call:  No answer/busy  [facility]  [facility], [facility] Guide Direct Dial: [phone]  Website: [email or web address]

## 2024-01-30 ENCOUNTER — Other Ambulatory Visit: Payer: Self-pay

## 2024-01-30 ENCOUNTER — Emergency Department
Admission: EM | Admit: 2024-01-30 | Discharge: 2024-01-30 | Disposition: A | Discharge status: Home / Self Care | Attending: Emergency Medicine | Admitting: Emergency Medicine

## 2024-01-30 ENCOUNTER — Encounter: Payer: Self-pay | Admitting: Emergency Medicine

## 2024-01-30 DIAGNOSIS — J45909 Unspecified asthma, uncomplicated: Secondary | ICD-10-CM | POA: Insufficient documentation

## 2024-01-30 DIAGNOSIS — R21 Rash and other nonspecific skin eruption: Secondary | ICD-10-CM | POA: Diagnosis present

## 2024-01-30 DIAGNOSIS — E119 Type 2 diabetes mellitus without complications: Secondary | ICD-10-CM | POA: Diagnosis not present

## 2024-01-30 DIAGNOSIS — I10 Essential (primary) hypertension: Secondary | ICD-10-CM | POA: Insufficient documentation

## 2024-01-30 DIAGNOSIS — J449 Chronic obstructive pulmonary disease, unspecified: Secondary | ICD-10-CM | POA: Insufficient documentation

## 2024-01-30 MED ORDER — TRIAMCINOLONE ACETONIDE 0.1 % EX CREA: 1.0000 | TOPICAL_CREAM | Freq: Three times a day (TID) | CUTANEOUS | 0 refills | Status: DC

## 2024-01-30 MED ORDER — TRIAMCINOLONE ACETONIDE 0.1 % EX CREA: 1.0000 | TOPICAL_CREAM | Freq: Three times a day (TID) | CUTANEOUS | 0 refills | Status: AC

## 2024-01-30 NOTE — ED Triage Notes (Addendum)
 Arrived pov, ambulatory to triage for rash on left arm  Per pt it has been there for about a week and I have been putting cream on it and it doesn't look like it's getting any better. I did change soap about a month ago   Rash noted to left arm  Pt refused BP in triage, NADN

## 2024-01-30 NOTE — ED Provider Notes (Signed)
 [facility] Emergency Department Provider Note     Event Date/Time   First MD Initiated Contact with Patient 01/30/24 1941     (approximate)   History   Rash   HPI  Kristie Santana is a 48 y.o. female with a history of diabetes mellitus, HTN, asthma and COPD presents to the ED for evaluation of a pruritic rash localized to her left arm.  No pain.  Patient reports she noticed this rash about a week ago.  She has been using hydrocortisone cream and eczema cream with no relief.  She reports she did change soaps recently.  She also reports she has been in contact with leaves and different trees while working.  Denies fever, oral swelling,  shortness of breath and insect bite.     Physical Exam   Triage Vital Signs: ED Triage Vitals  Encounter Vitals Group     BP --      Girls Systolic BP Percentile --      Girls Diastolic BP Percentile --      Boys Systolic BP Percentile --      Boys Diastolic BP Percentile --      Pulse Rate 01/30/24 1925 82     Resp 01/30/24 1925 17     Temp 01/30/24 1925 98.4 F (36.9 C)     Temp Source 01/30/24 1925 Oral     SpO2 01/30/24 1925 100 %     Weight 01/30/24 1927 205 lb 4.8 oz (93.1 kg)     Height 01/30/24 1926 5' 5 (1.651 m)     Head Circumference --      Peak Flow --      Pain Score --      Pain Loc --      Pain Education --      Exclude from Growth Chart --     Most recent vital signs: Vitals:   01/30/24 1925  Pulse: 82  Resp: 17  Temp: 98.4 F (36.9 C)  SpO2: 100%    General Awake, no distress.  HEENT NCAT.  CV:  Good peripheral perfusion.  RESP:  Normal effort.  ABD:  No distention.  Other:  Left arm reveals a diffuse maculopapular rash.  No erythema.  Skin remains intact.   ED Results / Procedures / Treatments   Labs (all labs ordered are listed, but only abnormal results are displayed) Labs Reviewed - No data to display  No results found.  PROCEDURES:  Critical Care performed:  No  Procedures  MEDICATIONS ORDERED IN ED: Medications - No data to display  IMPRESSION / MDM / ASSESSMENT AND PLAN / ED COURSE  I reviewed the triage vital signs and the nursing notes.                               48 y.o. female presents to the emergency department for evaluation and treatment of rash to her left arm. See HPI for further details.   Differential diagnosis includes, but is not limited to contact dermatitis, poison ivy, eczema, cellulitis considered but less likely given characteristic of rash, absence of warmth and redness  Patient's presentation is most consistent with acute complicated illness / injury requiring diagnostic workup.  Patient is alert and oriented.  She is hemodynamically stable.  Physical exam findings are stated above and overall benign.  Presentation is clinically consistent with contact dermatitis to what could possibly be poison  ivy although patient cannot describe the tree leaf or branch that she had rubbed her arm against.  Will prescribe steroid cream.  Advised Vaseline as a barrier cream and cool compress wraps.  Encouraged Benadryl p.o. for anti-itch relief and close follow-up with PCP for effectiveness of medication.  Patient verbalized understanding.  ED return precautions discussed.  Patient stable condition at discharge.   FINAL CLINICAL IMPRESSION(S) / ED DIAGNOSES   Final diagnoses:  Rash   Rx / DC Orders   ED Discharge Orders          Ordered    triamcinolone  cream (KENALOG ) 0.1 %  3 times daily,   Status:  Discontinued        01/30/24 2006    triamcinolone  cream (KENALOG ) 0.1 %  3 times daily        01/30/24 2006             Note:  This document was prepared using Dragon voice recognition software and may include unintentional dictation errors.    Phyllis Breeze, Joely Losier A, PA-C 01/30/24 2132    Iver Marker, MD 02/04/24 1018

## 2024-01-30 NOTE — Discharge Instructions (Addendum)
 You were evaluated in the ED for a rash to the left arm.  Presentation is clinically consistent with contact dermatitis.  I have sent in a steroid cream to your pharmacy to apply to the affected area 3 times daily.  Apply Vaseline on top of this cream as a barrier and then apply cool compress wraps to alleviate itch.  Follow-up with your primary care provider.
